# Patient Record
Sex: Male | Born: 1955
Health system: Southern US, Community
[De-identification: ages and names within clinical notes are randomized; demographics above are authoritative.]

## PROBLEM LIST (undated history)

## (undated) DIAGNOSIS — J9383 Other pneumothorax: Secondary | ICD-10-CM

## (undated) DIAGNOSIS — K219 Gastro-esophageal reflux disease without esophagitis: Secondary | ICD-10-CM

## (undated) DIAGNOSIS — J449 Chronic obstructive pulmonary disease, unspecified: Secondary | ICD-10-CM

---

## 2002-12-02 ENCOUNTER — Encounter: Admission: RE | Admit: 2002-12-02 | Discharge: 2002-12-02 | Payer: Self-pay | Admitting: Internal Medicine

## 2002-12-02 ENCOUNTER — Encounter: Payer: Self-pay | Admitting: Internal Medicine

## 2005-05-19 ENCOUNTER — Ambulatory Visit: Payer: Self-pay | Admitting: Pulmonary Disease

## 2005-09-10 ENCOUNTER — Ambulatory Visit: Payer: Self-pay | Admitting: Internal Medicine

## 2005-11-17 ENCOUNTER — Ambulatory Visit: Payer: Self-pay | Admitting: Internal Medicine

## 2005-11-20 ENCOUNTER — Ambulatory Visit: Payer: Self-pay | Admitting: Internal Medicine

## 2006-05-04 ENCOUNTER — Ambulatory Visit: Payer: Self-pay | Admitting: Internal Medicine

## 2007-08-31 ENCOUNTER — Encounter: Payer: Self-pay | Admitting: Internal Medicine

## 2007-08-31 DIAGNOSIS — E7800 Pure hypercholesterolemia, unspecified: Secondary | ICD-10-CM | POA: Insufficient documentation

## 2016-09-15 DIAGNOSIS — J9383 Other pneumothorax: Secondary | ICD-10-CM

## 2016-09-15 HISTORY — DX: Other pneumothorax: J93.83

## 2016-09-16 ENCOUNTER — Inpatient Hospital Stay (HOSPITAL_COMMUNITY)
Admission: AD | Admit: 2016-09-16 | Discharge: 2016-09-23 | DRG: 163 | Disposition: A | Discharge status: Home Health | Payer: 59 | Source: Other Acute Inpatient Hospital | Attending: Emergency Medicine | Admitting: Emergency Medicine

## 2016-09-16 ENCOUNTER — Inpatient Hospital Stay (HOSPITAL_COMMUNITY): Payer: 59

## 2016-09-16 ENCOUNTER — Encounter (HOSPITAL_COMMUNITY): Payer: Self-pay | Admitting: Thoracic Surgery (Cardiothoracic Vascular Surgery)

## 2016-09-16 DIAGNOSIS — E871 Hypo-osmolality and hyponatremia: Secondary | ICD-10-CM | POA: Diagnosis not present

## 2016-09-16 DIAGNOSIS — J431 Panlobular emphysema: Secondary | ICD-10-CM | POA: Diagnosis not present

## 2016-09-16 DIAGNOSIS — Z4682 Encounter for fitting and adjustment of non-vascular catheter: Secondary | ICD-10-CM

## 2016-09-16 DIAGNOSIS — J930 Spontaneous tension pneumothorax: Secondary | ICD-10-CM

## 2016-09-16 DIAGNOSIS — R739 Hyperglycemia, unspecified: Secondary | ICD-10-CM | POA: Diagnosis present

## 2016-09-16 DIAGNOSIS — J9382 Other air leak: Secondary | ICD-10-CM | POA: Diagnosis present

## 2016-09-16 DIAGNOSIS — R0602 Shortness of breath: Secondary | ICD-10-CM | POA: Diagnosis present

## 2016-09-16 DIAGNOSIS — D72829 Elevated white blood cell count, unspecified: Secondary | ICD-10-CM | POA: Diagnosis present

## 2016-09-16 DIAGNOSIS — Z87891 Personal history of nicotine dependence: Secondary | ICD-10-CM

## 2016-09-16 DIAGNOSIS — J432 Centrilobular emphysema: Secondary | ICD-10-CM | POA: Diagnosis not present

## 2016-09-16 DIAGNOSIS — Z9689 Presence of other specified functional implants: Secondary | ICD-10-CM

## 2016-09-16 DIAGNOSIS — F419 Anxiety disorder, unspecified: Secondary | ICD-10-CM | POA: Diagnosis not present

## 2016-09-16 DIAGNOSIS — Z825 Family history of asthma and other chronic lower respiratory diseases: Secondary | ICD-10-CM | POA: Diagnosis not present

## 2016-09-16 DIAGNOSIS — D751 Secondary polycythemia: Secondary | ICD-10-CM | POA: Diagnosis present

## 2016-09-16 DIAGNOSIS — K219 Gastro-esophageal reflux disease without esophagitis: Secondary | ICD-10-CM | POA: Diagnosis present

## 2016-09-16 DIAGNOSIS — J9383 Other pneumothorax: Secondary | ICD-10-CM

## 2016-09-16 DIAGNOSIS — J9621 Acute and chronic respiratory failure with hypoxia: Secondary | ICD-10-CM | POA: Diagnosis not present

## 2016-09-16 DIAGNOSIS — Z9911 Dependence on respirator [ventilator] status: Secondary | ICD-10-CM

## 2016-09-16 DIAGNOSIS — J449 Chronic obstructive pulmonary disease, unspecified: Secondary | ICD-10-CM

## 2016-09-16 DIAGNOSIS — Z09 Encounter for follow-up examination after completed treatment for conditions other than malignant neoplasm: Secondary | ICD-10-CM

## 2016-09-16 DIAGNOSIS — D62 Acute posthemorrhagic anemia: Secondary | ICD-10-CM | POA: Diagnosis not present

## 2016-09-16 DIAGNOSIS — J439 Emphysema, unspecified: Secondary | ICD-10-CM | POA: Diagnosis present

## 2016-09-16 DIAGNOSIS — R0902 Hypoxemia: Secondary | ICD-10-CM

## 2016-09-16 DIAGNOSIS — J9311 Primary spontaneous pneumothorax: Secondary | ICD-10-CM

## 2016-09-16 DIAGNOSIS — J4489 Other specified chronic obstructive pulmonary disease: Secondary | ICD-10-CM | POA: Diagnosis present

## 2016-09-16 DIAGNOSIS — J939 Pneumothorax, unspecified: Secondary | ICD-10-CM

## 2016-09-16 DIAGNOSIS — E876 Hypokalemia: Secondary | ICD-10-CM | POA: Diagnosis not present

## 2016-09-16 HISTORY — DX: Chronic obstructive pulmonary disease, unspecified: J44.9

## 2016-09-16 HISTORY — DX: Gastro-esophageal reflux disease without esophagitis: K21.9

## 2016-09-16 HISTORY — DX: Other pneumothorax: J93.83

## 2016-09-16 LAB — COMPREHENSIVE METABOLIC PANEL
ALBUMIN: 3.8 g/dL (ref 3.5–5.0)
ALK PHOS: 57 U/L (ref 38–126)
ALT: 20 U/L (ref 17–63)
ANION GAP: 11 (ref 5–15)
AST: 21 U/L (ref 15–41)
BILIRUBIN TOTAL: 1.4 mg/dL — AB (ref 0.3–1.2)
BUN: 14 mg/dL (ref 6–20)
CALCIUM: 9 mg/dL (ref 8.9–10.3)
CO2: 21 mmol/L — ABNORMAL LOW (ref 22–32)
Chloride: 105 mmol/L (ref 101–111)
Creatinine, Ser: 1.02 mg/dL (ref 0.61–1.24)
GFR calc Af Amer: >60 mL/min (ref >60)
GLUCOSE: 127 mg/dL — AB (ref 65–99)
Potassium: 4.4 mmol/L (ref 3.5–5.1)
Sodium: 137 mmol/L (ref 135–145)
TOTAL PROTEIN: 7.4 g/dL (ref 6.5–8.1)

## 2016-09-16 LAB — CBC WITH DIFFERENTIAL/PLATELET
BASOS ABS: 0 10*3/uL (ref 0.0–0.1)
BASOS PCT: 0 %
EOS PCT: 0 %
Eosinophils Absolute: 0 10*3/uL (ref 0.0–0.7)
HCT: 45.7 % (ref 39.0–52.0)
Hemoglobin: 16.1 g/dL (ref 13.0–17.0)
Lymphocytes Relative: 5 %
Lymphs Abs: 1 10*3/uL (ref 0.7–4.0)
MCH: 31.8 pg (ref 26.0–34.0)
MCHC: 35.2 g/dL (ref 30.0–36.0)
MCV: 90.3 fL (ref 78.0–100.0)
MONO ABS: 1.5 10*3/uL — AB (ref 0.1–1.0)
Monocytes Relative: 7 %
Neutro Abs: 17.6 10*3/uL — ABNORMAL HIGH (ref 1.7–7.7)
Neutrophils Relative %: 88 %
PLATELETS: 240 10*3/uL (ref 150–400)
RBC: 5.06 MIL/uL (ref 4.22–5.81)
RDW: 12.6 % (ref 11.5–15.5)
WBC: 20.1 10*3/uL — AB (ref 4.0–10.5)

## 2016-09-16 LAB — GLUCOSE, CAPILLARY
GLUCOSE-CAPILLARY: 130 mg/dL — AB (ref 65–99)
Glucose-Capillary: 105 mg/dL — ABNORMAL HIGH (ref 65–99)
Glucose-Capillary: 111 mg/dL — ABNORMAL HIGH (ref 65–99)

## 2016-09-16 LAB — PROTIME-INR
INR: 1.08
PROTHROMBIN TIME: 14 s (ref 11.4–15.2)

## 2016-09-16 LAB — SURGICAL PCR SCREEN
MRSA, PCR: NEGATIVE
Staphylococcus aureus: NEGATIVE

## 2016-09-16 LAB — APTT: APTT: 35 s (ref 24–36)

## 2016-09-16 MED ORDER — ALBUTEROL SULFATE (2.5 MG/3ML) 0.083% IN NEBU
2.5000 mg | INHALATION_SOLUTION | RESPIRATORY_TRACT | Status: DC | PRN
  Administered 2016-09-17: 2.5 mg via RESPIRATORY_TRACT
  Filled 2016-09-16: qty 3

## 2016-09-16 MED ORDER — IPRATROPIUM-ALBUTEROL 0.5-2.5 (3) MG/3ML IN SOLN: 3.0000 mL | Freq: Four times a day (QID) | RESPIRATORY_TRACT | Status: DC

## 2016-09-16 MED ORDER — INSULIN ASPART 100 UNIT/ML ~~LOC~~ SOLN
0.0000 [IU] | SUBCUTANEOUS | Status: DC
  Administered 2016-09-16: 1 [IU] via SUBCUTANEOUS

## 2016-09-16 MED ORDER — ENOXAPARIN SODIUM 40 MG/0.4ML ~~LOC~~ SOLN
40.0000 mg | SUBCUTANEOUS | Status: DC
  Filled 2016-09-16: qty 0.4

## 2016-09-16 MED ORDER — ORAL CARE MOUTH RINSE
15.0000 mL | Freq: Two times a day (BID) | OROMUCOSAL | Status: DC
  Administered 2016-09-16 – 2016-09-17 (×2): 15 mL via OROMUCOSAL

## 2016-09-16 MED ORDER — PANTOPRAZOLE SODIUM 40 MG PO TBEC: 40.0000 mg | DELAYED_RELEASE_TABLET | Freq: Every day | ORAL | Status: DC

## 2016-09-16 MED ORDER — FENTANYL CITRATE (PF) 100 MCG/2ML IJ SOLN
25.0000 ug | INTRAMUSCULAR | Status: DC | PRN
Start: 2016-09-16 — End: 2016-09-17
  Administered 2016-09-17: 25 ug via INTRAVENOUS
  Filled 2016-09-16: qty 2

## 2016-09-16 MED ORDER — IPRATROPIUM-ALBUTEROL 0.5-2.5 (3) MG/3ML IN SOLN
3.0000 mL | Freq: Four times a day (QID) | RESPIRATORY_TRACT | Status: DC
Start: 2016-09-16 — End: 2016-09-17
  Administered 2016-09-16 – 2016-09-17 (×3): 3 mL via RESPIRATORY_TRACT
  Filled 2016-09-16 (×3): qty 3

## 2016-09-16 MED ORDER — ENOXAPARIN SODIUM 30 MG/0.3ML ~~LOC~~ SOLN
30.0000 mg | SUBCUTANEOUS | Status: DC
  Administered 2016-09-16: 30 mg via SUBCUTANEOUS
  Filled 2016-09-16: qty 0.3

## 2016-09-16 MED ORDER — ONDANSETRON HCL 4 MG/2ML IJ SOLN: 4.0000 mg | Freq: Four times a day (QID) | INTRAMUSCULAR | Status: DC | PRN

## 2016-09-16 MED ORDER — ACETAMINOPHEN 325 MG PO TABS
650.0000 mg | ORAL_TABLET | ORAL | Status: DC | PRN
  Administered 2016-09-16: 650 mg via ORAL
  Filled 2016-09-16: qty 2

## 2016-09-16 MED ORDER — LACTATED RINGERS IV SOLN
INTRAVENOUS | Status: DC
  Administered 2016-09-16 – 2016-09-17 (×3): via INTRAVENOUS

## 2016-09-16 NOTE — H&P (Addendum)
PULMONARY / CRITICAL CARE MEDICINE   Name: Marlene LardJoseph T Mori MRN: [id number] DOB: 02/23/1956    ADMISSION DATE:  09/16/2016 CONSULTATION DATE:  11/7    REFERRING MD:  Transfer IN  CHIEF COMPLAINT:  PTX , leak , COPD  HISTORY OF PRESENT ILLNESS:   60 yr old h/o COPD, remote PNA, admitted 11/6 for abrupt SOB when getting out of bed. To hospital noted spont large PTX with total lung collapse. A small CT was placed then a larger and small removed. Chest tube continued to leak excessive air and was referred to [facility] for pul/ CVTS specialty care. No fevers. No CP After CT placement lung cam eup but remains with PTX moderate with leak described.  PAST MEDICAL HISTORY :  He  has no past medical history on file.  COPD Remote PNA Allerggies  PAST SURGICAL HISTORY: He  has no past surgical history on file.  Allergies  Allergen Reactions  . Meperidine Hcl     No current facility-administered medications on file prior to encounter.    No current outpatient prescriptions on file prior to encounter.    FAMILY HISTORY:  His has no family status information on file.  copd  SOCIAL HISTORY: He   nonsmoker, no drugs  REVIEW OF SYSTEMS:   Review of Systems  Constitutional: Positive for diaphoresis and malaise/fatigue. Negative for chills, fever and weight loss.  HENT: Negative for congestion, ear discharge, ear pain, hearing loss, nosebleeds and tinnitus.   Eyes: Negative for blurred vision, double vision, photophobia and pain.  Respiratory: Positive for shortness of breath and wheezing. Negative for cough, hemoptysis and sputum production.   Cardiovascular: Positive for chest pain. Negative for palpitations, orthopnea, claudication, leg swelling and PND.  Gastrointestinal: Negative for abdominal pain, diarrhea, heartburn and vomiting.  Genitourinary: Negative for dysuria, frequency, hematuria and urgency.  Musculoskeletal: Negative for back pain, myalgias and neck pain.  Skin: Negative  for itching and rash.  Neurological: Positive for dizziness. Negative for tingling, weakness and headaches.  Endo/Heme/Allergies: Negative for environmental allergies. Does not bruise/bleed easily.  Psychiatric/Behavioral: Negative for depression.     SUBJECTIVE:  Improved breathing overall  VITAL SIGNS: BP 128/89 (BP Location: Right Arm)   Pulse (!) 110   Temp 98.2 F (36.8 C) (Oral)   Resp (!) 22   Ht 6\' 1"  (1.854 m)   Wt 84.5 kg (186 lb 4.6 oz)   SpO2 90%   BMI 24.58 kg/m   HEMODYNAMICS:    VENTILATOR SETTINGS:    INTAKE / OUTPUT: No intake/output data recorded.  PHYSICAL EXAMINATION: General:  Awake, no distress Neuro:  NONFOCAL, A O x 4 HEENT:  jvd present wnl Cardiovascular:  s1 s2 RRR no longer tachy, no r/g Lungs:  Distant, no crepitus Abdomen:  Soft, BS wnl, no r Musculoskeletal:  No edema Skin:  No rash Leak on CT sahara  LABS:  BMET No results for input(s): NA, K, CL, CO2, BUN, CREATININE, GLUCOSE in the last 168 hours.  Electrolytes No results for input(s): CALCIUM, MG, PHOS in the last 168 hours.  CBC No results for input(s): WBC, HGB, HCT, PLT in the last 168 hours.  Coag's No results for input(s): APTT, INR in the last 168 hours.  Sepsis Markers No results for input(s): LATICACIDVEN, PROCALCITON, O2SATVEN in the last 168 hours.  ABG No results for input(s): PHART, PCO2ART, PO2ART in the last 168 hours.  Liver Enzymes No results for input(s): AST, ALT, ALKPHOS, BILITOT, ALBUMIN in the last 168  hours.  Cardiac Enzymes No results for input(s): TROPONINI, PROBNP in the last 168 hours.  Glucose No results for input(s): GLUCAP in the last 168 hours.  Imaging Dg Chest Port 1 View  Result Date: 09/16/2016 CLINICAL DATA:  Left side chest tubes EXAM: PORTABLE CHEST 1 VIEW COMPARISON:  None. FINDINGS: Cardiomediastinal silhouette is unremarkable. A left basilar chest tube is noted. There is about 20% left upper and left lateral  pneumothorax. Atelectasis noted bilateral basilar. IMPRESSION: Left basilar chest tube. About 20% left pneumothorax. Bilateral basilar atelectasis. Electronically Signed   By: Natasha MeadLiviu  Pop M.D.   On: 09/16/2016 17:12     STUDIES:  CT chest 11/6>>>large PTX, totoal left collapse from PTX, extensive COPD  CULTURES:   ANTIBIOTICS:   SIGNIFICANT EVENTS: 11/6- PTX spont, large with collapse, CT placed 11/7- no resolution of PTX fully with large leak, [facility]  LINES/TUBES: 11/6 outside hospt left CT>>>  ASSESSMENT / PLAN:  PULMONARY A: LArge PTX spont R/o BP fistula Large air leak, r/o intraparychmal placement SUSPECT advances COPD P:   appreciate CVTS consult Obtain CT chest to assess placement of CT and current residual PTX location pcxr now Albuteral IF CT not intraparenchymal may need additional CT placement vs VATS IS  CARDIOVASCULAR A:  Tachy resolved P:  Tele Treating PTX  RENAL A:   Prior normal CRT P:   LR okay for now, assess bmet now LR kvo if eating  GASTROINTESTINAL A:   R/o GERD P:   PPI  NPO for CT abnd possible other procedures  HEMATOLOGIC A:   Secondary polycythemia P:  Cbc coags now for possible procedures Dc lov scd add  INFECTIOUS A:   No infection present P:   Follow temp curve  ENDOCRINE A:   At risk hyperglcyemia P:   ssi  NEUROLOGIC A:   pain P:   RASS goal: 0 Consider prn fent   FAMILY  - Updates: to pt and all family in room  - Inter-disciplinary family meet or Palliative Care meeting due by: 11/14  Ccm time 35 min   Mcarthur Rossettianiel J. Tyson AliasFeinstein, MD, FACP Pgr: [phone] [facility]  Pulmonary and Critical Care Medicine [facility] Pager: [phone]  09/16/2016, 5:32 PM

## 2016-09-16 NOTE — Progress Notes (Signed)
eLink Physician-Brief Progress Note Patient Name: Jay LardJoseph T Mitchell DOB: 07/16/1956 MRN: [id number]   Date of Service  09/16/2016  HPI/Events of Note  Pt transferred from [facility] to [facility] for mgmt of spont PTX and persistent, large air leak  eICU Interventions  Stat PCXR ordered Basic admission orders entered Dr Tyson AliasFeinstein to see pt shortly      Intervention Category Evaluation Type: New Patient Evaluation  Merwyn KatosDavid B Georgenia Salim 09/16/2016, 4:32 PM

## 2016-09-16 NOTE — Consult Note (Signed)
[address]       [place] [id number]             [phone]          CARDIOTHORACIC SURGERY CONSULTATION REPORT  PCP is No primary care provider on file. Referring Provider is SIMONDS, DAVID, MD   Reason for consultation:  Spontaneous pneumothorax  HPI:  Patient is a 60 year old male with reported history of COPD, remote history of tobacco use, and GE reflux disease who was in his usual state of health until early yesterday morning when he developed sudden onset of left-sided chest pain and shortness of breath. At the time he was traveling in the [place] for business where he was attending a conference. He was evaluated in the emergency department there and found to have a large left pneumothorax with complete collapse of the left lung. A small bore chest tube was placed but the lung only partially reexpanded. Subsequently the initial chest tube was removed and the larger chest tube placed by a surgeon. The patient was noted to have persistent air leak, and at the patient's request he was transferred to [facility] for further management. Cardiothoracic surgical consultation was requested upon arrival.  The patient is married and lives [place] his [place] locally in [place] 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 He has a remote history of tobacco abuse but he quit smoking in 2004. He states that approximately 10 years ago he suffered from a case of pneumonia, and he was told that chest x-ray obtained at that time revealed findings consistent with COPD. He has remained physically active throughout his adult life although he does not exercise on a regular basis. He reports no significant physical limitations prior to acute onset of chest pain and shortness of breath that occurred early yesterday morning. He specifically denies any problems with shortness of breath, wheezing,  or frequent bouts of bronchitis prior to admission.  The patient has no previous history of spontaneous pneumothorax and he specifically denies any recent history of trauma.  He states that initially after chest tube placement he had fairly severe pain across left side of his chest. Pain has improved considerably over the last 6-12 hours and at present he has minimal discomfort associated with the tube. The patient denies any shortness of breath at present and reports that his breathing is much better than it was prior to initial chest tube placement.  Past Medical History:  Diagnosis Date  . COPD (chronic obstructive pulmonary disease) (HCC)   . GERD (gastroesophageal reflux disease)   . Spontaneous pneumothorax 09/15/2016   left    History reviewed. No pertinent surgical history.  History reviewed. No pertinent family history.  Social History   Social History  . Marital status: Married    Spouse name: N/A  . Number of children: N/A  . Years of education: N/A   Occupational History  . Not on file.   Social History Main Topics  . Smoking status: Former Smoker    Packs/day: 1.00    Types: Cigarettes    Quit date: 2004  . Smokeless tobacco: Never Used  . Alcohol use No  . Drug use: No  . Sexual activity: Not on file   Other Topics Concern  . Not on file   Social History Narrative  . No narrative on file    Prior to Admission medications  Not on File    Current Facility-Administered Medications  Medication Dose Route Frequency Provider Last Rate Last Dose  . acetaminophen (TYLENOL) tablet 650 mg  650 mg Oral Q4H PRN Merwyn Katosavid B Simonds, MD      . albuterol (PROVENTIL) (2.5 MG/3ML) 0.083% nebulizer solution 2.5 mg  2.5 mg Nebulization Q2H PRN Merwyn Katosavid B Simonds, MD      . fentaNYL (SUBLIMAZE) injection 25 mcg  25 mcg Intravenous Q2H PRN Nelda Bucksaniel J Feinstein, MD      . insulin aspart (novoLOG) injection 0-9 Units  0-9 Units Subcutaneous Q4H Nelda Bucksaniel J Feinstein, MD      .  ipratropium-albuterol (DUONEB) 0.5-2.5 (3) MG/3ML nebulizer solution 3 mL  3 mL Nebulization Q6H Merwyn Katosavid B Simonds, MD      . lactated ringers infusion   Intravenous Continuous Purcell Nailslarence H Faithlynn Deeley, MD 50 mL/hr at 09/16/16 1650    . ondansetron (ZOFRAN) injection 4 mg  4 mg Intravenous Q6H PRN Merwyn Katosavid B Simonds, MD      . Melene Muller[START ON 09/17/2016] pantoprazole (PROTONIX) EC tablet 40 mg  40 mg Oral Q1200 Nelda Bucksaniel J Feinstein, MD        Allergies  Allergen Reactions  . Meperidine Hcl       Review of Systems:   General:  normal appetite, normal energy, no weight gain, no weight loss, no fever  Cardiac:  no chest pain with exertion, + chest pain at rest which began yesterday, no SOB with exertion, + resting SOB which began yesterday, no PND, no orthopnea, no palpitations, no arrhythmia, no atrial fibrillation, no LE edema, no dizzy spells, no syncope  Respiratory:  + shortness of breath, no home oxygen, no productive cough, no dry cough, no bronchitis, n wheezing, no hemoptysis, no asthma, + pain with inspiration or cough, no sleep apnea, no CPAP at night  GI:   no difficulty swallowing, + reflux, no frequent heartburn, no hiatal hernia, no abdominal pain, no constipation, no diarrhea, no hematochezia, no hematemesis, no melena  GU:   no dysuria,  no frequency, no urinary tract infection, no hematuria, no enlarged prostate, + kidney stones, no kidney disease  Vascular:  no pain suggestive of claudication, no pain in feet, no leg cramps, no varicose veins, no DVT, no non-healing foot ulcer  Neuro:   no stroke, no TIA's, no seizures, no headaches, no temporary blindness one eye,  no slurred speech, no peripheral neuropathy, no chronic pain, no instability of gait, no memory/cognitive dysfunction  Musculoskeletal: no arthritis, no joint swelling, + myalgias, no difficulty walking, normal mobility   Skin:   no rash, no itching, no skin infections, no pressure sores or ulcerations  Psych:   no anxiety, no  depression, no nervousness, no unusual recent stress  Eyes:   no blurry vision, no floaters, no recent vision changes, + wears glasses or contacts  ENT:   no hearing loss, no loose or painful teeth, no dentures  Hematologic:  no easy bruising, no abnormal bleeding, no clotting disorder, no frequent epistaxis  Endocrine:  no diabetes, does not check CBG's at home     Physical Exam:   BP 128/89 (BP Location: Right Arm)   Pulse (!) 110   Temp 98.2 F (36.8 C) (Oral)   Resp (!) 22   Ht 6\' 1"  (1.854 m)   Wt 186 lb 4.6 oz (84.5 kg)   SpO2 90%   BMI 24.58 kg/m   General:    well-appearing  HEENT:  Unremarkable   Neck:  no JVD, no bruits, no adenopathy   Chest:   clear to auscultation, symmetrical breath sounds, no wheezes, no rhonchi  Chest tube:  Single chest tube in place left chest, + small air leak with tube on water seal  CV:   RRR, no  murmur   Abdomen:  soft, non-tender, no masses   Extremities:  warm, well-perfused, pulses palpable, no lower extremity edema  Rectal/GU  Deferred  Neuro:   Grossly non-focal and symmetrical throughout  Skin:   Clean and dry, no rashes, no breakdown  Diagnostic Tests:  PORTABLE CHEST 1 VIEW  COMPARISON:  None.  FINDINGS: Cardiomediastinal silhouette is unremarkable. A left basilar chest tube is noted. There is about 20% left upper and left lateral pneumothorax. Atelectasis noted bilateral basilar.  IMPRESSION: Left basilar chest tube. About 20% left pneumothorax. Bilateral basilar atelectasis.   Electronically Signed   By: Natasha Mead M.D.   On: 09/16/2016 17:12    Impression:  Patient with reported history of COPD and remote history of tobacco abuse presents with first time primary spontaneous pneumothorax on the left side.  Chest tube was placed at outside hospital and clinically the patient appears quite stable and reports feeling much improved. He has a small persistent air leak with the tube on water seal. Chest x-ray  demonstrates reexpansion of the left lung with approximately 20% residual left apical pneumothorax. The tube may be in the major fissure, although position is difficult to tell for certain on the existing portable chest x-ray.   Plan:  Will proceed with chest CT scan to verify position of the existing chest tube and further evaluate anatomical characteristics of the patient's underlying COPD. Depending upon findings we will discuss treatment options further.   I spent in excess of 60 minutes during the conduct of this hospital consultation and >50% of this time involved direct face-to-face encounter for counseling and/or coordination of the patient's care.   Salvatore Decent. Cornelius Moras, MD 09/16/2016 5:54 PM

## 2016-09-16 NOTE — Significant Event (Addendum)
Accepted patient from OSH into 2M08. Patient arrived to unit on nonrebreather, two PIV on each ACs, and pleural tube on left chest that was on suctioned at -20cm en route from the outside hospital to [facility]. Large air leak noted on CT. CT back to waterseal, staff changed to new sahara. Still with persistent air leak. Personal belongings include a cellphone and pajama pants. No family at bedside-they are en route to hospital. Patient oriented to room and unit. Bed in lowest position. CCM MD notified and camera in room.

## 2016-09-17 ENCOUNTER — Inpatient Hospital Stay (HOSPITAL_COMMUNITY): Payer: 59

## 2016-09-17 ENCOUNTER — Encounter (HOSPITAL_COMMUNITY): Payer: Self-pay | Admitting: Anesthesiology

## 2016-09-17 ENCOUNTER — Inpatient Hospital Stay (HOSPITAL_COMMUNITY): Payer: 59 | Admitting: Anesthesiology

## 2016-09-17 ENCOUNTER — Encounter (HOSPITAL_COMMUNITY)
Admission: AD | Disposition: A | Discharge status: Home Health | Payer: Self-pay | Source: Other Acute Inpatient Hospital | Attending: Thoracic Surgery (Cardiothoracic Vascular Surgery)

## 2016-09-17 DIAGNOSIS — Z09 Encounter for follow-up examination after completed treatment for conditions other than malignant neoplasm: Secondary | ICD-10-CM

## 2016-09-17 DIAGNOSIS — J439 Emphysema, unspecified: Secondary | ICD-10-CM | POA: Diagnosis present

## 2016-09-17 DIAGNOSIS — J930 Spontaneous tension pneumothorax: Secondary | ICD-10-CM

## 2016-09-17 HISTORY — PX: VIDEO ASSISTED THORACOSCOPY: SHX5073

## 2016-09-17 LAB — BASIC METABOLIC PANEL
Anion gap: 10 (ref 5–15)
BUN: 15 mg/dL (ref 6–20)
CALCIUM: 8.8 mg/dL — AB (ref 8.9–10.3)
CHLORIDE: 102 mmol/L (ref 101–111)
CO2: 25 mmol/L (ref 22–32)
CREATININE: 1 mg/dL (ref 0.61–1.24)
GFR calc Af Amer: >60 mL/min (ref >60)
GFR calc non Af Amer: >60 mL/min (ref >60)
Glucose, Bld: 103 mg/dL — ABNORMAL HIGH (ref 65–99)
Potassium: 4.5 mmol/L (ref 3.5–5.1)
SODIUM: 137 mmol/L (ref 135–145)

## 2016-09-17 LAB — CBC
HCT: 42.7 % (ref 39.0–52.0)
HEMOGLOBIN: 14.5 g/dL (ref 13.0–17.0)
MCH: 30.9 pg (ref 26.0–34.0)
MCHC: 34 g/dL (ref 30.0–36.0)
MCV: 90.9 fL (ref 78.0–100.0)
PLATELETS: 241 10*3/uL (ref 150–400)
RBC: 4.7 MIL/uL (ref 4.22–5.81)
RDW: 12.5 % (ref 11.5–15.5)
WBC: 14.8 10*3/uL — ABNORMAL HIGH (ref 4.0–10.5)

## 2016-09-17 LAB — TYPE AND SCREEN
ABO/RH(D): O POS
ANTIBODY SCREEN: NEGATIVE

## 2016-09-17 LAB — GLUCOSE, CAPILLARY
GLUCOSE-CAPILLARY: 110 mg/dL — AB (ref 65–99)
GLUCOSE-CAPILLARY: 150 mg/dL — AB (ref 65–99)
Glucose-Capillary: 99 mg/dL (ref 65–99)
Glucose-Capillary: 169 mg/dL — ABNORMAL HIGH (ref 65–99)
Glucose-Capillary: 146 mg/dL — ABNORMAL HIGH (ref 65–99)

## 2016-09-17 LAB — ABO/RH: ABO/RH(D): O POS

## 2016-09-17 SURGERY — VIDEO ASSISTED THORACOSCOPY
Anesthesia: General | Site: Chest | Laterality: Left

## 2016-09-17 MED ORDER — DIPHENHYDRAMINE HCL 50 MG/ML IJ SOLN: 12.5000 mg | Freq: Four times a day (QID) | INTRAMUSCULAR | Status: DC | PRN

## 2016-09-17 MED ORDER — POTASSIUM CHLORIDE 10 MEQ/50ML IV SOLN: 10.0000 meq | Freq: Every day | INTRAVENOUS | Status: DC | PRN

## 2016-09-17 MED ORDER — DEXAMETHASONE SODIUM PHOSPHATE 10 MG/ML IJ SOLN
INTRAMUSCULAR | Status: DC | PRN
  Administered 2016-09-17: 10 mg via INTRAVENOUS

## 2016-09-17 MED ORDER — METOCLOPRAMIDE HCL 5 MG/ML IJ SOLN: 10.0000 mg | Freq: Once | INTRAMUSCULAR | Status: DC | PRN

## 2016-09-17 MED ORDER — CEFUROXIME SODIUM 1.5 G IJ SOLR
1.5000 g | Freq: Two times a day (BID) | INTRAMUSCULAR | Status: AC
  Administered 2016-09-17 – 2016-09-18 (×2): 1.5 g via INTRAVENOUS
  Filled 2016-09-17 (×2): qty 1.5

## 2016-09-17 MED ORDER — CEFAZOLIN SODIUM-DEXTROSE 2-3 GM-% IV SOLR
INTRAVENOUS | Status: DC | PRN
  Administered 2016-09-17: 2 g via INTRAVENOUS

## 2016-09-17 MED ORDER — ONDANSETRON HCL 4 MG/2ML IJ SOLN: 4.0000 mg | Freq: Four times a day (QID) | INTRAMUSCULAR | Status: DC | PRN

## 2016-09-17 MED ORDER — INSULIN ASPART 100 UNIT/ML ~~LOC~~ SOLN
0.0000 [IU] | SUBCUTANEOUS | Status: DC
  Administered 2016-09-17: 2 [IU] via SUBCUTANEOUS
  Administered 2016-09-17: 4 [IU] via SUBCUTANEOUS
  Administered 2016-09-18 (×3): 2 [IU] via SUBCUTANEOUS

## 2016-09-17 MED ORDER — ONDANSETRON HCL 4 MG/2ML IJ SOLN
INTRAMUSCULAR | Status: DC | PRN
  Administered 2016-09-17: 4 mg via INTRAVENOUS

## 2016-09-17 MED ORDER — PROPOFOL 10 MG/ML IV BOLUS
INTRAVENOUS | Status: AC
  Filled 2016-09-17: qty 20

## 2016-09-17 MED ORDER — ACETAMINOPHEN 500 MG PO TABS
1000.0000 mg | ORAL_TABLET | Freq: Four times a day (QID) | ORAL | Status: DC
  Administered 2016-09-17 – 2016-09-18 (×3): 1000 mg
  Filled 2016-09-17 (×5): qty 2

## 2016-09-17 MED ORDER — SENNOSIDES-DOCUSATE SODIUM 8.6-50 MG PO TABS
1.0000 | ORAL_TABLET | Freq: Every day | ORAL | Status: DC
  Administered 2016-09-18 – 2016-09-20 (×3): 1 via ORAL
  Filled 2016-09-17 (×4): qty 1

## 2016-09-17 MED ORDER — FENTANYL CITRATE (PF) 100 MCG/2ML IJ SOLN: 100.0000 ug | INTRAMUSCULAR | Status: DC | PRN

## 2016-09-17 MED ORDER — FENTANYL CITRATE (PF) 100 MCG/2ML IJ SOLN
INTRAMUSCULAR | Status: AC
  Administered 2016-09-17: 50 ug via INTRAVENOUS
  Filled 2016-09-17: qty 2

## 2016-09-17 MED ORDER — MIDAZOLAM HCL 5 MG/5ML IJ SOLN
INTRAMUSCULAR | Status: DC | PRN
  Administered 2016-09-17 (×2): 1 mg via INTRAVENOUS

## 2016-09-17 MED ORDER — GLYCOPYRROLATE 0.2 MG/ML IJ SOLN
INTRAMUSCULAR | Status: DC | PRN
Start: 2016-09-17 — End: 2016-09-17
  Administered 2016-09-17: 0.4 mg via INTRAVENOUS

## 2016-09-17 MED ORDER — DOCUSATE SODIUM 50 MG/5ML PO LIQD: 100.0000 mg | Freq: Two times a day (BID) | ORAL | Status: DC | PRN

## 2016-09-17 MED ORDER — TRAMADOL HCL 50 MG PO TABS
50.0000 mg | ORAL_TABLET | Freq: Four times a day (QID) | ORAL | Status: DC | PRN
  Administered 2016-09-17 – 2016-09-19 (×6): 100 mg via ORAL
  Filled 2016-09-17 (×6): qty 2

## 2016-09-17 MED ORDER — BISACODYL 5 MG PO TBEC: 10.0000 mg | DELAYED_RELEASE_TABLET | Freq: Every day | ORAL | Status: DC

## 2016-09-17 MED ORDER — FENTANYL 40 MCG/ML IV SOLN: INTRAVENOUS | Status: DC

## 2016-09-17 MED ORDER — DEXTROSE-NACL 5-0.45 % IV SOLN: INTRAVENOUS | Status: DC

## 2016-09-17 MED ORDER — DEXMEDETOMIDINE HCL IN NACL 200 MCG/50ML IV SOLN: 0.0000 ug/kg/h | INTRAVENOUS | Status: DC

## 2016-09-17 MED ORDER — LACTATED RINGERS IV SOLN: INTRAVENOUS | Status: DC

## 2016-09-17 MED ORDER — SUGAMMADEX SODIUM 200 MG/2ML IV SOLN
INTRAVENOUS | Status: DC | PRN
  Administered 2016-09-17: 200 mg via INTRAVENOUS

## 2016-09-17 MED ORDER — PANTOPRAZOLE SODIUM 40 MG PO TBEC: 40.0000 mg | DELAYED_RELEASE_TABLET | Freq: Every day | ORAL | Status: DC

## 2016-09-17 MED ORDER — SODIUM CHLORIDE 0.9% FLUSH: 9.0000 mL | INTRAVENOUS | Status: DC | PRN

## 2016-09-17 MED ORDER — ACETAMINOPHEN 160 MG/5ML PO SOLN: 1000.0000 mg | Freq: Four times a day (QID) | ORAL | Status: DC

## 2016-09-17 MED ORDER — NEOSTIGMINE METHYLSULFATE 10 MG/10ML IV SOLN
INTRAVENOUS | Status: DC | PRN
  Administered 2016-09-17: 3 mg via INTRAVENOUS

## 2016-09-17 MED ORDER — FENTANYL CITRATE (PF) 250 MCG/5ML IJ SOLN
INTRAMUSCULAR | Status: AC
  Filled 2016-09-17: qty 5

## 2016-09-17 MED ORDER — DIPHENHYDRAMINE HCL 12.5 MG/5ML PO ELIX: 12.5000 mg | ORAL_SOLUTION | Freq: Four times a day (QID) | ORAL | Status: DC | PRN

## 2016-09-17 MED ORDER — LIDOCAINE HCL (CARDIAC) 20 MG/ML IV SOLN
INTRAVENOUS | Status: DC | PRN
  Administered 2016-09-17: 100 mg via INTRAVENOUS

## 2016-09-17 MED ORDER — LABETALOL HCL 5 MG/ML IV SOLN
INTRAVENOUS | Status: DC | PRN
  Administered 2016-09-17: 5 mg via INTRAVENOUS

## 2016-09-17 MED ORDER — FENTANYL CITRATE (PF) 100 MCG/2ML IJ SOLN
25.0000 ug | INTRAMUSCULAR | Status: DC | PRN
  Administered 2016-09-17 (×2): 50 ug via INTRAVENOUS

## 2016-09-17 MED ORDER — ALBUTEROL SULFATE (2.5 MG/3ML) 0.083% IN NEBU: 2.5000 mg | INHALATION_SOLUTION | RESPIRATORY_TRACT | Status: DC

## 2016-09-17 MED ORDER — BISACODYL 10 MG RE SUPP: 10.0000 mg | Freq: Every day | RECTAL | Status: DC | PRN

## 2016-09-17 MED ORDER — FENTANYL CITRATE (PF) 250 MCG/5ML IJ SOLN
INTRAMUSCULAR | Status: AC
  Filled 2016-09-17: qty 10

## 2016-09-17 MED ORDER — OXYCODONE HCL 5 MG PO TABS: 5.0000 mg | ORAL_TABLET | ORAL | Status: DC | PRN

## 2016-09-17 MED ORDER — ACETAMINOPHEN 160 MG/5ML PO SOLN
1000.0000 mg | Freq: Four times a day (QID) | ORAL | Status: DC
  Filled 2016-09-17: qty 40.6

## 2016-09-17 MED ORDER — MIDAZOLAM HCL 10 MG/2ML IJ SOLN
INTRAMUSCULAR | Status: AC
  Filled 2016-09-17: qty 2

## 2016-09-17 MED ORDER — FENTANYL CITRATE (PF) 100 MCG/2ML IJ SOLN
INTRAMUSCULAR | Status: DC | PRN
  Administered 2016-09-17 (×3): 50 ug via INTRAVENOUS
  Administered 2016-09-17 (×2): 100 ug via INTRAVENOUS
  Administered 2016-09-17: 50 ug via INTRAVENOUS
  Administered 2016-09-17: 100 ug via INTRAVENOUS

## 2016-09-17 MED ORDER — ACETAMINOPHEN 500 MG PO TABS: 1000.0000 mg | ORAL_TABLET | Freq: Four times a day (QID) | ORAL | Status: DC

## 2016-09-17 MED ORDER — ROCURONIUM BROMIDE 100 MG/10ML IV SOLN
INTRAVENOUS | Status: DC | PRN
  Administered 2016-09-17: 50 mg via INTRAVENOUS
  Administered 2016-09-17: 20 mg via INTRAVENOUS
  Administered 2016-09-17: 50 mg via INTRAVENOUS

## 2016-09-17 MED ORDER — LACTATED RINGERS IV SOLN
INTRAVENOUS | Status: DC | PRN
  Administered 2016-09-17 (×2): via INTRAVENOUS

## 2016-09-17 MED ORDER — PROPOFOL 10 MG/ML IV BOLUS
INTRAVENOUS | Status: DC | PRN
Start: 2016-09-17 — End: 2016-09-17
  Administered 2016-09-17: 160 mg via INTRAVENOUS
  Administered 2016-09-17: 150 mg via INTRAVENOUS
  Administered 2016-09-17: 40 mg via INTRAVENOUS

## 2016-09-17 MED ORDER — LORATADINE 10 MG PO TABS: 10.0000 mg | ORAL_TABLET | Freq: Every day | ORAL | Status: DC

## 2016-09-17 MED ORDER — NALOXONE HCL 0.4 MG/ML IJ SOLN: 0.4000 mg | INTRAMUSCULAR | Status: DC | PRN

## 2016-09-17 MED ORDER — 0.9 % SODIUM CHLORIDE (POUR BTL) OPTIME
TOPICAL | Status: DC | PRN
  Administered 2016-09-17: 1000 mL
  Administered 2016-09-17: 2000 mL

## 2016-09-17 MED ORDER — FAMOTIDINE IN NACL 20-0.9 MG/50ML-% IV SOLN: 20.0000 mg | Freq: Two times a day (BID) | INTRAVENOUS | Status: DC

## 2016-09-17 MED ORDER — IPRATROPIUM-ALBUTEROL 0.5-2.5 (3) MG/3ML IN SOLN: 3.0000 mL | Freq: Four times a day (QID) | RESPIRATORY_TRACT | Status: DC

## 2016-09-17 MED ORDER — ALBUTEROL SULFATE (2.5 MG/3ML) 0.083% IN NEBU: 2.5000 mg | INHALATION_SOLUTION | Freq: Four times a day (QID) | RESPIRATORY_TRACT | Status: DC

## 2016-09-17 MED ORDER — PNEUMOCOCCAL VAC POLYVALENT 25 MCG/0.5ML IJ INJ: 0.5000 mL | INJECTION | INTRAMUSCULAR | Status: DC

## 2016-09-17 SURGICAL SUPPLY — 79 items
APPLIER CLIP ROT 10 11.4 M/L (STAPLE)
CANISTER SUCTION 2500CC (MISCELLANEOUS) ×2 IMPLANT
CATH KIT ON Q 5IN SLV (PAIN MANAGEMENT) IMPLANT
CATH THORACIC 28FR (CATHETERS) ×2 IMPLANT
CATH THORACIC 28FR RT ANG (CATHETERS) IMPLANT
CATH THORACIC 36FR (CATHETERS) IMPLANT
CATH THORACIC 36FR RT ANG (CATHETERS) IMPLANT
CLEANER TIP ELECTROSURG 2X2 (MISCELLANEOUS) ×2 IMPLANT
CLIP APPLIE ROT 10 11.4 M/L (STAPLE) IMPLANT
CLIP TI MEDIUM 6 (CLIP) IMPLANT
CONN Y 3/8X3/8X3/8  BEN (MISCELLANEOUS) ×1
CONN Y 3/8X3/8X3/8 BEN (MISCELLANEOUS) ×1 IMPLANT
CONT SPEC 4OZ CLIKSEAL STRL BL (MISCELLANEOUS) ×16 IMPLANT
COVER SURGICAL LIGHT HANDLE (MISCELLANEOUS) ×2 IMPLANT
DERMABOND ADVANCED (GAUZE/BANDAGES/DRESSINGS) ×1
DERMABOND ADVANCED .7 DNX12 (GAUZE/BANDAGES/DRESSINGS) ×1 IMPLANT
DRAIN CHANNEL 28F RND 3/8 FF (WOUND CARE) IMPLANT
DRAIN CHANNEL 32F RND 10.7 FF (WOUND CARE) IMPLANT
DRAPE LAPAROSCOPIC ABDOMINAL (DRAPES) ×2 IMPLANT
DRAPE WARM FLUID 44X44 (DRAPE) ×2 IMPLANT
ELECT BLADE 6.5 EXT (BLADE) ×2 IMPLANT
ELECT REM PT RETURN 9FT ADLT (ELECTROSURGICAL) ×2
ELECTRODE REM PT RTRN 9FT ADLT (ELECTROSURGICAL) ×1 IMPLANT
GAUZE SPONGE 4X4 12PLY STRL (GAUZE/BANDAGES/DRESSINGS) ×2 IMPLANT
GLOVE BIO SURGEON STRL SZ 6.5 (GLOVE) ×4 IMPLANT
GLOVE SURG SIGNA 7.5 PF LTX (GLOVE) ×4 IMPLANT
GOWN STRL REUS W/ TWL LRG LVL3 (GOWN DISPOSABLE) ×2 IMPLANT
GOWN STRL REUS W/ TWL XL LVL3 (GOWN DISPOSABLE) ×1 IMPLANT
GOWN STRL REUS W/TWL LRG LVL3 (GOWN DISPOSABLE) ×2
GOWN STRL REUS W/TWL XL LVL3 (GOWN DISPOSABLE) ×1
HANDLE UNIV ENDO GIA (ENDOMECHANICALS) ×2 IMPLANT
HEMOSTAT SURGICEL 2X14 (HEMOSTASIS) IMPLANT
KIT BASIN OR (CUSTOM PROCEDURE TRAY) ×2 IMPLANT
KIT ROOM TURNOVER OR (KITS) ×2 IMPLANT
KIT SUCTION CATH 14FR (SUCTIONS) ×2 IMPLANT
NS IRRIG 1000ML POUR BTL (IV SOLUTION) ×4 IMPLANT
PACK CHEST (CUSTOM PROCEDURE TRAY) ×2 IMPLANT
PAD ARMBOARD 7.5X6 YLW CONV (MISCELLANEOUS) ×4 IMPLANT
POUCH ENDO CATCH II 15MM (MISCELLANEOUS) IMPLANT
POUCH SPECIMEN RETRIEVAL 10MM (ENDOMECHANICALS) IMPLANT
RELOAD EGIA 45 MED/THCK PURPLE (STAPLE) ×8 IMPLANT
RELOAD TRI 45 ART MED THCK BLK (STAPLE) ×6 IMPLANT
RELOAD TRI 45 ART MED THCK PUR (STAPLE) ×2 IMPLANT
RELOAD TRI 60 ART MED THCK PUR (STAPLE) ×10 IMPLANT
SEALANT PROGEL (MISCELLANEOUS) IMPLANT
SEALANT SURG COSEAL 4ML (VASCULAR PRODUCTS) IMPLANT
SEALANT SURG COSEAL 8ML (VASCULAR PRODUCTS) IMPLANT
SOLUTION ANTI FOG 6CC (MISCELLANEOUS) ×2 IMPLANT
SPECIMEN JAR MEDIUM (MISCELLANEOUS) ×2 IMPLANT
SPONGE GAUZE 4X4 12PLY STER LF (GAUZE/BANDAGES/DRESSINGS) ×4 IMPLANT
SPONGE INTESTINAL PEANUT (DISPOSABLE) IMPLANT
SPONGE TONSIL 1 RF SGL (DISPOSABLE) ×2 IMPLANT
SUT PROLENE 4 0 RB 1 (SUTURE)
SUT PROLENE 4-0 RB1 .5 CRCL 36 (SUTURE) IMPLANT
SUT SILK  1 MH (SUTURE) ×2
SUT SILK 1 MH (SUTURE) ×2 IMPLANT
SUT SILK 2 0SH CR/8 30 (SUTURE) IMPLANT
SUT SILK 3 0SH CR/8 30 (SUTURE) IMPLANT
SUT VIC AB 1 CTX 36 (SUTURE) ×1
SUT VIC AB 1 CTX36XBRD ANBCTR (SUTURE) ×1 IMPLANT
SUT VIC AB 2-0 CTX 36 (SUTURE) IMPLANT
SUT VIC AB 2-0 UR6 27 (SUTURE) IMPLANT
SUT VIC AB 3-0 MH 27 (SUTURE) IMPLANT
SUT VIC AB 3-0 X1 27 (SUTURE) ×2 IMPLANT
SUT VICRYL 2 TP 1 (SUTURE) IMPLANT
SWAB COLLECTION DEVICE MRSA (MISCELLANEOUS) IMPLANT
SYSTEM SAHARA CHEST DRAIN ATS (WOUND CARE) ×2 IMPLANT
TAPE CLOTH 4X10 WHT NS (GAUZE/BANDAGES/DRESSINGS) ×2 IMPLANT
TAPE CLOTH SURG 4X10 WHT LF (GAUZE/BANDAGES/DRESSINGS) ×4 IMPLANT
TIP APPLICATOR SPRAY EXTEND 16 (VASCULAR PRODUCTS) IMPLANT
TOWEL OR 17X24 6PK STRL BLUE (TOWEL DISPOSABLE) ×2 IMPLANT
TOWEL OR 17X26 10 PK STRL BLUE (TOWEL DISPOSABLE) ×4 IMPLANT
TRAP SPECIMEN MUCOUS 40CC (MISCELLANEOUS) IMPLANT
TRAY FOLEY CATH 16FRSI W/METER (SET/KITS/TRAYS/PACK) ×2 IMPLANT
TROCAR XCEL BLADELESS 5X75MML (TROCAR) ×2 IMPLANT
TROCAR XCEL NON-BLD 5MMX100MML (ENDOMECHANICALS) IMPLANT
TUBE ANAEROBIC SPECIMEN COL (MISCELLANEOUS) IMPLANT
TUNNELER SHEATH ON-Q 11GX8 DSP (PAIN MANAGEMENT) IMPLANT
WATER STERILE IRR 1000ML POUR (IV SOLUTION) ×4 IMPLANT

## 2016-09-17 NOTE — Anesthesia Preprocedure Evaluation (Addendum)
Anesthesia Evaluation  Patient identified by MRN, date of birth, ID band Patient awake    Reviewed: Allergy & Precautions, NPO status , Patient's Chart, lab work & pertinent test results  Airway Mallampati: II  TM Distance: >3 FB Neck ROM: Full    Dental no notable dental hx.    Pulmonary COPD, former smoker,  Spontaneous PTX    + decreased breath sounds      Cardiovascular negative cardio ROS Normal cardiovascular exam Rhythm:Regular Rate:Normal     Neuro/Psych negative neurological ROS  negative psych ROS   GI/Hepatic Neg liver ROS,   Endo/Other  negative endocrine ROS  Renal/GU negative Renal ROS  negative genitourinary   Musculoskeletal negative musculoskeletal ROS (+)   Abdominal   Peds negative pediatric ROS (+)  Hematology negative hematology ROS (+)   Anesthesia Other Findings   Reproductive/Obstetrics negative OB ROS                            Anesthesia Physical Anesthesia Plan  ASA: III  Anesthesia Plan: General   Post-op Pain Management:    Induction: Intravenous  Airway Management Planned: Double Lumen EBT  Additional Equipment: Arterial line  Intra-op Plan:   Post-operative Plan: Extubation in OR  Informed Consent: I have reviewed the patients History and Physical, chart, labs and discussed the procedure including the risks, benefits and alternatives for the proposed anesthesia with the patient or authorized representative who has indicated his/her understanding and acceptance.   Dental advisory given  Plan Discussed with: CRNA  Anesthesia Plan Comments:        Anesthesia Quick Evaluation

## 2016-09-17 NOTE — Progress Notes (Signed)
      [address]       [place] [id number]             [phone]      Reintubated in OR, recently extubated again  BP 127/83   Pulse 96   Temp 99.7 F (37.6 C) (Oral)   Resp (!) 23   Ht 6\' 1"  (1.854 m)   Wt 190 lb 7.6 oz (86.4 kg)   SpO2 (!) 89%   BMI 25.13 kg/m    Intake/Output Summary (Last 24 hours) at 09/17/16 1720 Last data filed at 09/17/16 1600  Gross per 24 hour  Intake             2456 ml  Output             1205 ml  Net             1251 ml    No significant air leak  CXR OK  Cartier Mapel C. Dorris FetchHendrickson, MD [facility] [phone]

## 2016-09-17 NOTE — Progress Notes (Addendum)
eLink Physician-Brief Progress Note Patient Name: Marlene LardJoseph T Dolley DOB: 01/20/1956 MRN: [id number]   Date of Service  09/17/2016  HPI/Events of Note  Returned from OR on vent  eICU Interventions  Vent stabilization order set Sedation order set     Intervention Category Major Interventions: Respiratory failure - evaluation and management  Merwyn KatosDavid B Colvin Blatt 09/17/2016, 3:18 PM

## 2016-09-17 NOTE — Progress Notes (Signed)
eLink Physician-Brief Progress Note Patient Name: Jay LardJoseph T Mitchell DOB: 01/17/1956 MRN: [id number]   Date of Service  09/17/2016  HPI/Events of Note  Review of CXR reveals a small residual L pneumothorax which appears to be smaller than the previous CXR.   eICU Interventions  Will place L chest tube to -20 cm H2O suction.     Intervention Category Intermediate Interventions: Respiratory distress - evaluation and management  Sommer,Steven Eugene 09/17/2016, 4:32 AM

## 2016-09-17 NOTE — Progress Notes (Signed)
eLink Physician-Brief Progress Note Patient Name: Jay LardJoseph T Mitchell DOB: 06/28/1956 MRN: [id number]   Date of Service  09/17/2016  HPI/Events of Note  Did not tolerate going to -20 cm H2O suction. Sats and pain worse.   eICU Interventions  Will place L chest tube back on water seal.      Intervention Category Major Interventions: Hypoxemia - evaluation and management  Mohini Heathcock Eugene 09/17/2016, 4:55 AM

## 2016-09-17 NOTE — Progress Notes (Signed)
Pt transported to short stay area and verbal report given to receiving nurse Pt placed on cardiac monitor, 02 and wife at bedside.

## 2016-09-17 NOTE — Procedures (Addendum)
Extubation Procedure Note  Patient Details:   Name: Jay Mitchell DOB: 07/23/1956 MRN: [id number]   Airway Documentation:     Evaluation  O2 sats: stable throughout Complications: No apparent complications Patient did tolerate procedure well. Bilateral Breath Sounds: Diminished, Clear   Yes   Positive cuff leak prior to extubation. Pt placed on [place] 4 Lpm, sat dropped to 78%, RT placed pt on Venturi Mask 14 L 55%, sat improved to 88%.  MD aware.  Rayburn FeltJean S Nicol Herbig 09/17/2016, 4:31 PM

## 2016-09-17 NOTE — Progress Notes (Signed)
eLink Physician-Brief Progress Note Patient Name: Jay LardJoseph T Mitchell DOB: 04/03/1956 MRN: [id number]   Date of Service  09/17/2016  HPI/Events of Note  Bedside nurse reports increased air leak and increased chest pain.   eICU Interventions  Will get 5 AM portable CXR now to be sure that pneumothorax is adequately decompressed.      Intervention Category Intermediate Interventions: Pain - evaluation and management  Jay Mitchell 09/17/2016, 4:08 AM

## 2016-09-17 NOTE — Progress Notes (Signed)
Name: Jay LardJoseph T Mitchell MRN:   [id number] DOB:   08/12/1956           ADMISSION DATE:  09/16/2016 CONSULTATION DATE:  11/7                        REFERRING MD:  Transfer IN  CHIEF COMPLAINT:  PTX , leak , COPD  Brief:   60 yr old h/o COPD, remote PNA, admitted 11/6 for abrupt SOB when getting out of bed. To hospital noted spont large PTX with total lung collapse. A small CT was placed then a larger and small removed. Chest tube continued to leak excessive air and was referred to [facility] for pul/ CVTS specialty care. No fevers. No CP After CT placement lung cam eup but remains with PTX moderate with leak described.  SUBJECTIVE:  Improved breathing overall  VITAL SIGNS: BP 128/89 (BP Location: Right Arm)   Pulse (!) 110   Temp 98.2 F (36.8 C) (Oral)   Resp (!) 22   Ht 6\' 1"  (1.854 m)   Wt 84.5 kg (186 lb 4.6 oz)   SpO2 90%   BMI 24.58 kg/m   HEMODYNAMICS:    VENTILATOR SETTINGS:    INTAKE / OUTPUT: No intake/output data recorded.  PHYSICAL EXAMINATION: General:  Awake, no distress Neuro: alert and oriented, follows commands  HEENT:  jvd present wnl Cardiovascular:  s1 s2 RRR, no r/g,  Lungs:  Distant, increased work of breathing, left chest tube in place   Abdomen:  Soft, active bowel sounds  Musculoskeletal:  No edema Skin:  warm, dry, intact   LABS:  BMET Last Labs   No results for input(s): NA, K, CL, CO2, BUN, CREATININE, GLUCOSE in the last 168 hours.    Electrolytes Last Labs   No results for input(s): CALCIUM, MG, PHOS in the last 168 hours.    CBC Last Labs   No results for input(s): WBC, HGB, HCT, PLT in the last 168 hours.    Coag's Last Labs   No results for input(s): APTT, INR in the last 168 hours.    Sepsis Markers Last Labs   No results for input(s): LATICACIDVEN, PROCALCITON, O2SATVEN in the last 168 hours.    ABG Last Labs   No results for input(s): PHART, PCO2ART, PO2ART in the last 168 hours.    Liver  Enzymes Last Labs   No results for input(s): AST, ALT, ALKPHOS, BILITOT, ALBUMIN in the last 168 hours.    Cardiac Enzymes Last Labs   No results for input(s): TROPONINI, PROBNP in the last 168 hours.    Glucose Last Labs   No results for input(s): GLUCAP in the last 168 hours.    Imaging Dg Chest Port 1 View  Result Date: 09/16/2016 CLINICAL DATA:  Left side chest tubes EXAM: PORTABLE CHEST 1 VIEW COMPARISON:  None. FINDINGS: Cardiomediastinal silhouette is unremarkable. A left basilar chest tube is noted. There is about 20% left upper and left lateral pneumothorax. Atelectasis noted bilateral basilar. IMPRESSION: Left basilar chest tube. About 20% left pneumothorax. Bilateral basilar atelectasis. Electronically Signed   By: Natasha MeadLiviu  Pop M.D.   On: 09/16/2016 17:12     STUDIES:  CT chest 11/6>>>large PTX, totoal left collapse from PTX, extensive COPD CT Chest 11/6 >> Persistent left pneumothorax relatively stable from the prior chest x-ray. Chest tube is noted in satisfactory position. Severe emphysematous changes as described. Bibasilar infiltrates worse on the left accentuated by the pneumothorax.  CULTURES:  ANTIBIOTICS:   SIGNIFICANT EVENTS: 11/6- PTX spont, large with collapse, CT placed 11/7- no resolution of PTX fully with large leak, [facility] OR for BLEBECTOMY, apical pleurectomy, mechanical pleural abrasion   LINES/TUBES: 11/6 outside hospt left CT>>>1/8 in OR 1/8 chest tube in [facility] at [facility] left>>> 1/8 ETT (re intubated pacu)>>>  ASSESSMENT / PLAN:  PULMONARY A: LArge PTX spont S/p BLEBECTOMY, apical pleurectomy, mechanical pleural abrasion advances Bolous COPD P:   cvta to OR, see attending note IS  CARDIOVASCULAR A:  Tachy resolved P:  Tele  RENAL A:   Prior normal CRT P:   Strict I &O  Trend BMP  Replace electrolytes as needed   GASTROINTESTINAL A:   R/o GERD P:   PPI  NPO  HEMATOLOGIC A:   Secondary  polycythemia P:  Trend CBC scd  INFECTIOUS A:   No infection present P:   Follow temp and WBC trend   ENDOCRINE A:   At risk hyperglcyemia P:   SSI  Q4H Glucose checks   NEUROLOGIC A:   pain P:   RASS goal: 0 Consider prn fent   FAMILY  - Updates: to pt and all family in room  - Inter-disciplinary family meet or Palliative Care meeting due by: 11/14   Jay Mitchell, AG-ACNP [facility]  Pgr: [phone]  PCCM Pgr: [phone]  STAFF NOTE: Jay Mitchell, Jay Fernandez, MD FACP have personally reviewed patient's available data, including medical history, events of note, physical examination and test results as part of my evaluation. I have discussed with resident/NP and other care providers such as pharmacist, RN and RRT. In addition, I personally evaluated patient and elicited key findings of: post op now in 2S s/p VATS BLEBECTOMY, apical pleurectomy, mechanical pleural abrasion secondary to persitant leak and future risk for continued leak, bleb eruption, lungs distant, chest tube in place, does follow simple commands, abdo soft, he was hypoxic post op and re intubated  By anesthesia, on rest would assess ABG on current 8 cc/kg, would prefer peep 3 if able, obtain abg if remain on rest, he is more awake, would likely to move to SBT cpap 5 ps 5 goal 30 min and assess abg , pcxr all reviewed lung is up post op, WUA now, off all continuous sedation, albuterol neb needed, family is at the bedside and I have updated them, goal is extubation I hope his neurostatus would improve as we recover him in ICU, chest tube management per cvts, when comfortable would like ot re add sub q heparin if remains on vent, we can utilize precedex if remains on vent overnight, also he was on GERD treatment , now on vent will add back ensure The patient is critically ill with multiple organ systems failure and requires high complexity decision making for assessment and support,  frequent evaluation and titration of therapies, application of advanced monitoring technologies and extensive interpretation of multiple databases.   Critical Care Time devoted to patient care services described in this note is 35 Minutes. This time reflects time of care of this signee: Jay Percyaniel Nasreen Goedecke, MD FACP. This critical care time does not reflect procedure time, or teaching time or supervisory time of PA/NP/Med student/Med Resident etc but could involve care discussion time. Rest per NP/medical resident whose note is outlined above and that I agree with   Jay Rossettianiel J. Tyson AliasFeinstein, MD, FACP Pgr: [phone] [facility] 09/17/2016 3:48 PM

## 2016-09-17 NOTE — Progress Notes (Addendum)
      [address]       [place] [id number]             [phone]           Subjective: Patient states breathing is better. Has pain at left chest tube site.  Objective: Vital signs in last 24 hours: Temp:  [97.2 F (36.2 C)-99.2 F (37.3 C)] 97.4 F (36.3 C) (11/08 0745) Pulse Rate:  [94-115] 99 (11/08 0700) Cardiac Rhythm: Normal sinus rhythm (11/08 0400) Resp:  [14-27] 22 (11/08 0700) BP: (107-142)/(68-89) 120/77 (11/08 0700) SpO2:  [88 %-97 %] 95 % (11/08 0809) Weight:  [186 lb 4.6 oz (84.5 kg)-190 lb 7.6 oz (86.4 kg)] 190 lb 7.6 oz (86.4 kg) (11/08 0500)     Intake/Output from previous day: 11/07 0701 - 11/08 0700 In: 414.3 [P.O.:240; I.V.:174.3] Out: 450 [Urine:400; Chest Tube:50]   Physical Exam:  Cardiovascular: RRR Pulmonary: Diminished left apex Wounds: Dressing is clean and dry.  Chest Tube: to suction, 3+ air leak  Lab Results: CBC: Recent Labs  09/16/16 1703 09/17/16 0215  WBC 20.1* 14.8*  HGB 16.1 14.5  HCT 45.7 42.7  PLT 240 241   BMET:  Recent Labs  09/16/16 1703 09/17/16 0215  NA 137 137  K 4.4 4.5  CL 105 102  CO2 21* 25  GLUCOSE 127* 103*  BUN 14 15  CREATININE 1.02 1.00  CALCIUM 9.0 8.8*    PT/INR:  Recent Labs  09/16/16 1703  LABPROT 14.0  INR 1.08   ABG:  INR: Will add last result for INR, ABG once components are confirmed Will add last 4 CBG results once components are confirmed  Assessment/Plan:   Pulmonary - Chest tube is to suction and there is a 3+ air leak. CXR this am shows decreased left pneumothorax and lung still not completely re expanded.CT showed severe emphysematous changes are noted with multiple blebs and bulla particularly in the upper lobes bilaterally. Some mild infiltrative changes are noted in the right lower lobe as well as more marked consolidation in the left lower lobe. This is likely related to the known pneumothorax. Left thoracostomy catheter is noted in  satisfactory position although the lung has not shown complete re-expansion. The pneumothorax was likely related to a ruptured bleb. Patient would likely benefit from VATS, stapling of blebs. Dr. Cornelius Moraswen to evaluate.  ZIMMERMAN,DONIELLE MPA-C 09/17/2016,8:57 AM  I have seen and examined the patient and agree with the assessment and plan as outlined.  Patient has significant persistent air leak with tube on water seal.  Chest CT reveals existing tube in good position but severe parenchymal lung disease due to COPD with large blebs in both lungs. Today's CXR reveals further decrease in size of residual PTX but increased consolidation in LLL suggestive of acute lung injury versus pneumonia.  I suspect the best course of action is to proceed with surgery for left VATS, possible thoracotomy for bleb resection and mechanical pleurodesis.  Alternatively we could leave chest tube in place on water seal indefinitely, but likelihood of good long term result without surgery is probably poor.  Risks of surgery will be considerable, with concerns for persistent air leak, pneumonia, possible respiratory failure.  I discussed options at length with the patient and his wife at bedside.  Will look into possibility of proceeding with surgery today.  All questions answered.  Purcell Nailslarence H Owen, MD 09/17/2016 9:29 AM

## 2016-09-17 NOTE — H&P (View-Only) (Signed)
[address]       [place] [id number]             [phone]          CARDIOTHORACIC SURGERY CONSULTATION REPORT  PCP is No primary care provider on file. Referring Provider is SIMONDS, DAVID, MD   Reason for consultation:  Spontaneous pneumothorax  HPI:  Patient is a 60 year old male with reported history of COPD, remote history of tobacco use, and GE reflux disease who was in his usual state of health until early yesterday morning when he developed sudden onset of left-sided chest pain and shortness of breath. At the time he was traveling in the [place] for business where he was attending a conference. He was evaluated in the emergency department there and found to have a large left pneumothorax with complete collapse of the left lung. A small bore chest tube was placed but the lung only partially reexpanded. Subsequently the initial chest tube was removed and the larger chest tube placed by a surgeon. The patient was noted to have persistent air leak, and at the patient's request he was transferred [facility] for further management. Cardiothoracic surgical consultation was requested upon arrival.  The patient is married and lives [place] his [place] locally in [place]. 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 He has a remote history of tobacco abuse but he quit smoking in 2004. He states that approximately 10 years ago he suffered from a case of pneumonia, and he was told that chest x-ray obtained at that time revealed findings consistent with COPD. He has remained physically active throughout his adult life although he does not exercise on a regular basis. He reports no significant physical limitations prior to acute onset of chest pain and shortness of breath that occurred early yesterday morning. He specifically denies any problems with shortness of breath, wheezing,  or frequent bouts of bronchitis prior to admission.  The patient has no previous history of spontaneous pneumothorax and he specifically denies any recent history of trauma.  He states that initially after chest tube placement he had fairly severe pain across left side of his chest. Pain has improved considerably over the last 6-12 hours and at present he has minimal discomfort associated with the tube. The patient denies any shortness of breath at present and reports that his breathing is much better than it was prior to initial chest tube placement.  Past Medical History:  Diagnosis Date  . COPD (chronic obstructive pulmonary disease) (HCC)   . GERD (gastroesophageal reflux disease)   . Spontaneous pneumothorax 09/15/2016   left    History reviewed. No pertinent surgical history.  History reviewed. No pertinent family history.  Social History   Social History  . Marital status: Married    Spouse name: N/A  . Number of children: N/A  . Years of education: N/A   Occupational History  . Not on file.   Social History Main Topics  . Smoking status: Former Smoker    Packs/day: 1.00    Types: Cigarettes    Quit date: 2004  . Smokeless tobacco: Never Used  . Alcohol use No  . Drug use: No  . Sexual activity: Not on file   Other Topics Concern  . Not on file   Social History Narrative  . No narrative on file    Prior to Admission medications  Not on File    Current Facility-Administered Medications  Medication Dose Route Frequency Provider Last Rate Last Dose  . acetaminophen (TYLENOL) tablet 650 mg  650 mg Oral Q4H PRN Merwyn Katosavid B Simonds, MD      . albuterol (PROVENTIL) (2.5 MG/3ML) 0.083% nebulizer solution 2.5 mg  2.5 mg Nebulization Q2H PRN Merwyn Katosavid B Simonds, MD      . fentaNYL (SUBLIMAZE) injection 25 mcg  25 mcg Intravenous Q2H PRN Nelda Bucksaniel J Feinstein, MD      . insulin aspart (novoLOG) injection 0-9 Units  0-9 Units Subcutaneous Q4H Nelda Bucksaniel J Feinstein, MD      .  ipratropium-albuterol (DUONEB) 0.5-2.5 (3) MG/3ML nebulizer solution 3 mL  3 mL Nebulization Q6H Merwyn Katosavid B Simonds, MD      . lactated ringers infusion   Intravenous Continuous Purcell Nailslarence H Heloise Gordan, MD 50 mL/hr at 09/16/16 1650    . ondansetron (ZOFRAN) injection 4 mg  4 mg Intravenous Q6H PRN Merwyn Katosavid B Simonds, MD      . Melene Muller[START ON 09/17/2016] pantoprazole (PROTONIX) EC tablet 40 mg  40 mg Oral Q1200 Nelda Bucksaniel J Feinstein, MD        Allergies  Allergen Reactions  . Meperidine Hcl       Review of Systems:   General:  normal appetite, normal energy, no weight gain, no weight loss, no fever  Cardiac:  no chest pain with exertion, + chest pain at rest which began yesterday, no SOB with exertion, + resting SOB which began yesterday, no PND, no orthopnea, no palpitations, no arrhythmia, no atrial fibrillation, no LE edema, no dizzy spells, no syncope  Respiratory:  + shortness of breath, no home oxygen, no productive cough, no dry cough, no bronchitis, n wheezing, no hemoptysis, no asthma, + pain with inspiration or cough, no sleep apnea, no CPAP at night  GI:   no difficulty swallowing, + reflux, no frequent heartburn, no hiatal hernia, no abdominal pain, no constipation, no diarrhea, no hematochezia, no hematemesis, no melena  GU:   no dysuria,  no frequency, no urinary tract infection, no hematuria, no enlarged prostate, + kidney stones, no kidney disease  Vascular:  no pain suggestive of claudication, no pain in feet, no leg cramps, no varicose veins, no DVT, no non-healing foot ulcer  Neuro:   no stroke, no TIA's, no seizures, no headaches, no temporary blindness one eye,  no slurred speech, no peripheral neuropathy, no chronic pain, no instability of gait, no memory/cognitive dysfunction  Musculoskeletal: no arthritis, no joint swelling, + myalgias, no difficulty walking, normal mobility   Skin:   no rash, no itching, no skin infections, no pressure sores or ulcerations  Psych:   no anxiety, no  depression, no nervousness, no unusual recent stress  Eyes:   no blurry vision, no floaters, no recent vision changes, + wears glasses or contacts  ENT:   no hearing loss, no loose or painful teeth, no dentures  Hematologic:  no easy bruising, no abnormal bleeding, no clotting disorder, no frequent epistaxis  Endocrine:  no diabetes, does not check CBG's at home     Physical Exam:   BP 128/89 (BP Location: Right Arm)   Pulse (!) 110   Temp 98.2 F (36.8 C) (Oral)   Resp (!) 22   Ht 6\' 1"  (1.854 m)   Wt 186 lb 4.6 oz (84.5 kg)   SpO2 90%   BMI 24.58 kg/m   General:    well-appearing  HEENT:  Unremarkable   Neck:  no JVD, no bruits, no adenopathy   Chest:   clear to auscultation, symmetrical breath sounds, no wheezes, no rhonchi  Chest tube:  Single chest tube in place left chest, + small air leak with tube on water seal  CV:   RRR, no  murmur   Abdomen:  soft, non-tender, no masses   Extremities:  warm, well-perfused, pulses palpable, no lower extremity edema  Rectal/GU  Deferred  Neuro:   Grossly non-focal and symmetrical throughout  Skin:   Clean and dry, no rashes, no breakdown  Diagnostic Tests:  PORTABLE CHEST 1 VIEW  COMPARISON:  None.  FINDINGS: Cardiomediastinal silhouette is unremarkable. A left basilar chest tube is noted. There is about 20% left upper and left lateral pneumothorax. Atelectasis noted bilateral basilar.  IMPRESSION: Left basilar chest tube. About 20% left pneumothorax. Bilateral basilar atelectasis.   Electronically Signed   By: Natasha Mead M.D.   On: 09/16/2016 17:12    Impression:  Patient with reported history of COPD and remote history of tobacco abuse presents with first time primary spontaneous pneumothorax on the left side.  Chest tube was placed at outside hospital and clinically the patient appears quite stable and reports feeling much improved. He has a small persistent air leak with the tube on water seal. Chest x-ray  demonstrates reexpansion of the left lung with approximately 20% residual left apical pneumothorax. The tube may be in the major fissure, although position is difficult to tell for certain on the existing portable chest x-ray.   Plan:  Will proceed with chest CT scan to verify position of the existing chest tube and further evaluate anatomical characteristics of the patient's underlying COPD. Depending upon findings we will discuss treatment options further.   I spent in excess of 60 minutes during the conduct of this hospital consultation and >50% of this time involved direct face-to-face encounter for counseling and/or coordination of the patient's care.   Salvatore Decent. Cornelius Moras, MD 09/16/2016 5:54 PM

## 2016-09-17 NOTE — Progress Notes (Signed)
Patient re-intubated in OR and placed on vent in PACU. PRVC 650, 12, 50%, +5. 7.5 ETT at 24 secured with pink tape. Patient transported to 2S, and report called to Alvino BloodSue Palmer,RRT. RT to monitor as needed.

## 2016-09-17 NOTE — Anesthesia Postprocedure Evaluation (Signed)
Anesthesia Post Note  Patient: Jay Mitchell  Procedure(s) Performed: Procedure(s) (LRB): VIDEO ASSISTED THORACOSCOPY multiple/BLEBECTOMY, apical pleurectomy, mechanical pleural abrasion (Left)  Patient location during evaluation: SICU Anesthesia Type: General Level of consciousness: sedated Pain management: pain level controlled Vital Signs Assessment: post-procedure vital signs reviewed and stable Respiratory status: patient remains intubated per anesthesia plan Cardiovascular status: stable Anesthetic complications: no    Last Vitals:  Vitals:   09/17/16 1437 09/17/16 1452  BP: 112/65 116/73  Pulse: 85 86  Resp: 12 14  Temp:      Last Pain:  Vitals:   09/17/16 1453  TempSrc:   PainSc: 0-No pain                 Saraiah Bhat DANIEL

## 2016-09-17 NOTE — Transfer of Care (Signed)
Immediate Anesthesia Transfer of Care Note  Patient: Marlene LardJoseph T Wray  Procedure(s) Performed: Procedure(s): VIDEO ASSISTED THORACOSCOPY multiple/BLEBECTOMY, apical pleurectomy, mechanical pleural abrasion (Left)  Patient Location: PACU  Anesthesia Type:General  Level of Consciousness: sedated and Patient remains intubated per anesthesia plan  Airway & Oxygen Therapy: Patient remains intubated per anesthesia plan and Patient placed on Ventilator (see vital sign flow sheet for setting)  Post-op Assessment: Report given to RN and Post -op Vital signs reviewed and stable  Post vital signs: Reviewed and stable  Last Vitals:  Vitals:   09/17/16 0900 09/17/16 1422  BP: 128/81   Pulse: (!) 110   Resp: (!) 24   Temp:  (P) 36.4 C    Last Pain:  Vitals:   09/17/16 0900  TempSrc:   PainSc: 2       Patients Stated Pain Goal: 0 (09/16/16 1626)  Complications: No apparent anesthesia complications

## 2016-09-17 NOTE — Interval H&P Note (Signed)
History and Physical Interval Note:  Asked by Dr. Cornelius Moraswen to see patient and do Left VATS, blebectomy  He is a 60 yo former smoker with a history of COPD. No cardiac history. He developed a left spontaneous pneumothorax [place] chest [place] and [facility] was [facility]. A CXR showed a residual pneumothorax. A CT chest showed severe bullous empysematous disease bilaterally. He is currently stable on [place] O2.  I discussed the natural history of spontaneous pneumothorax with [name]. He is at high risk for a persistent air leak and even if the leak stops is almost certain to have recurrent pneumothoraces. Best option is to proceed with VATS blebectomy and pleural abrasion. I informed them of the general nature of the procedure, the need for general anesthesia, the use of a chest tube postop, the expected stay and overall recovery. They understand the risks include but are not limited to death, MI, stroke, DVT, PE, bleeding, possible need for transfusion, infection, prolonged air leak, cardiac arrhythmias as well as the possibility of other unforeseeable complications. Operation does not eliminate the possibility of a recurrence. He accepts the risks and agrees to proceed.  Jay DecentSteven C. Dorris FetchHendrickson, MD [facility] [phone]  09/17/2016 9:53 AM  Jay Mitchell  has presented today for surgery, with the diagnosis of Left lung blebs  The various methods of treatment have been discussed with the patient and family. After consideration of risks, benefits and other options for treatment, the patient has consented to  Procedure(s): VIDEO ASSISTED THORACOSCOPY/BLEBECTOMY (Left) as a surgical intervention .  The patient's history has been reviewed, patient examined, no change in status, stable for surgery.  I have reviewed the patient's chart and labs.  Questions were answered to the patient's satisfaction.     Loreli SlotSteven C Alida Greiner

## 2016-09-17 NOTE — Brief Op Note (Addendum)
09/16/2016 - 09/17/2016  1:16 PM  PATIENT:  Jay Mitchell  60 y.o. male  PRE-OPERATIVE DIAGNOSIS:  Left spontaneous pneumothorax due to ruptured bleb  POST-OPERATIVE DIAGNOSIS:  Left spontaneous pneumothorax due to ruptured bleb  PROCEDURE:  Procedure(s): VIDEO ASSISTED THORACOSCOPY multiple/BLEBECTOMY, apical pleurectomy, mechanical pleural abrasion (Left)  SURGEON:  Surgeon(s) and Role:    * Loreli SlotSteven C Alaiza Yau, MD - Primary  PHYSICIAN ASSISTANT:  Jari Favreessa Conte, PA-C  ANESTHESIA:   general  EBL:  Total I/O In: 1600 [I.V.:1600] Out: 605 [Urine:540; Blood:65]  BLOOD ADMINISTERED:none  DRAINS: 28 F CT  LOCAL MEDICATIONS USED:  NONE  SPECIMEN:  Source of Specimen:  right upper lobe blebs  DISPOSITION OF SPECIMEN:  PATHOLOGY  COUNTS:  YES  PLAN OF CARE: Admit to inpatient   PATIENT DISPOSITION:  ICU - extubated and stable.   Delay start of Pharmacological VTE agent (>24hrs) due to surgical blood loss or risk of bleeding: yes

## 2016-09-18 ENCOUNTER — Inpatient Hospital Stay (HOSPITAL_COMMUNITY): Payer: 59

## 2016-09-18 ENCOUNTER — Encounter (HOSPITAL_COMMUNITY): Payer: Self-pay | Admitting: Thoracic Surgery (Cardiothoracic Vascular Surgery)

## 2016-09-18 LAB — POCT I-STAT 3, ART BLOOD GAS (G3+)
Acid-Base Excess: 1 mmol/L (ref 0.0–2.0)
BICARBONATE: 26.4 mmol/L (ref 20.0–28.0)
O2 Saturation: 90 %
PCO2 ART: 43.2 mmHg (ref 32.0–48.0)
TCO2: 28 mmol/L (ref 0–100)
pH, Arterial: 7.392 (ref 7.350–7.450)
pO2, Arterial: 58 mmHg — ABNORMAL LOW (ref 83.0–108.0)

## 2016-09-18 LAB — GLUCOSE, CAPILLARY
GLUCOSE-CAPILLARY: 146 mg/dL — AB (ref 65–99)
GLUCOSE-CAPILLARY: 109 mg/dL — AB (ref 65–99)
Glucose-Capillary: 137 mg/dL — ABNORMAL HIGH (ref 65–99)
Glucose-Capillary: 116 mg/dL — ABNORMAL HIGH (ref 65–99)
Glucose-Capillary: 117 mg/dL — ABNORMAL HIGH (ref 65–99)

## 2016-09-18 LAB — BASIC METABOLIC PANEL
Anion gap: 6 (ref 5–15)
BUN: 12 mg/dL (ref 6–20)
CHLORIDE: 101 mmol/L (ref 101–111)
CO2: 27 mmol/L (ref 22–32)
Calcium: 8.3 mg/dL — ABNORMAL LOW (ref 8.9–10.3)
Creatinine, Ser: 0.83 mg/dL (ref 0.61–1.24)
GFR calc non Af Amer: >60 mL/min (ref >60)
Glucose, Bld: 151 mg/dL — ABNORMAL HIGH (ref 65–99)
POTASSIUM: 4.1 mmol/L (ref 3.5–5.1)
SODIUM: 134 mmol/L — AB (ref 135–145)

## 2016-09-18 LAB — CBC
HCT: 37.9 % — ABNORMAL LOW (ref 39.0–52.0)
Hemoglobin: 13 g/dL (ref 13.0–17.0)
MCH: 30.9 pg (ref 26.0–34.0)
MCHC: 34.3 g/dL (ref 30.0–36.0)
MCV: 90 fL (ref 78.0–100.0)
Platelets: 237 10*3/uL (ref 150–400)
RBC: 4.21 MIL/uL — ABNORMAL LOW (ref 4.22–5.81)
RDW: 12.2 % (ref 11.5–15.5)
WBC: 17.2 10*3/uL — ABNORMAL HIGH (ref 4.0–10.5)

## 2016-09-18 LAB — OSMOLALITY, URINE: OSMOLALITY UR: 609 mosm/kg (ref 300–900)

## 2016-09-18 MED ORDER — FUROSEMIDE 10 MG/ML IJ SOLN
20.0000 mg | Freq: Once | INTRAMUSCULAR | Status: AC
  Administered 2016-09-18: 20 mg via INTRAVENOUS
  Filled 2016-09-18: qty 2

## 2016-09-18 MED ORDER — ALBUMIN HUMAN 25 % IV SOLN
12.5000 g | Freq: Once | INTRAVENOUS | Status: AC
  Administered 2016-09-18: 12.5 g via INTRAVENOUS
  Filled 2016-09-18: qty 50

## 2016-09-18 MED ORDER — SODIUM CHLORIDE 0.9 % IV SOLN
INTRAVENOUS | Status: DC
  Administered 2016-09-18: 08:00:00 via INTRAVENOUS

## 2016-09-18 MED ORDER — INSULIN ASPART 100 UNIT/ML ~~LOC~~ SOLN
0.0000 [IU] | Freq: Three times a day (TID) | SUBCUTANEOUS | Status: DC
  Administered 2016-09-20 – 2016-09-21 (×2): 2 [IU] via SUBCUTANEOUS
  Administered 2016-09-21: 0 [IU] via SUBCUTANEOUS
  Administered 2016-09-23: 2 [IU] via SUBCUTANEOUS

## 2016-09-18 MED ORDER — ACETAMINOPHEN 160 MG/5ML PO SOLN
1000.0000 mg | Freq: Four times a day (QID) | ORAL | Status: AC
  Filled 2016-09-18: qty 40.6

## 2016-09-18 MED ORDER — ALBUTEROL SULFATE (2.5 MG/3ML) 0.083% IN NEBU
2.5000 mg | INHALATION_SOLUTION | Freq: Four times a day (QID) | RESPIRATORY_TRACT | Status: DC
  Administered 2016-09-18 – 2016-09-19 (×5): 2.5 mg via RESPIRATORY_TRACT
  Filled 2016-09-18 (×5): qty 3

## 2016-09-18 MED ORDER — ACETAMINOPHEN 500 MG PO TABS
1000.0000 mg | ORAL_TABLET | Freq: Four times a day (QID) | ORAL | Status: AC
  Administered 2016-09-18 – 2016-09-22 (×17): 1000 mg via ORAL
  Filled 2016-09-18 (×16): qty 2

## 2016-09-18 MED ORDER — KETOROLAC TROMETHAMINE 30 MG/ML IJ SOLN
30.0000 mg | Freq: Four times a day (QID) | INTRAMUSCULAR | Status: AC
  Administered 2016-09-18 – 2016-09-19 (×8): 30 mg via INTRAVENOUS
  Filled 2016-09-18 (×8): qty 1

## 2016-09-18 MED ORDER — ENOXAPARIN SODIUM 40 MG/0.4ML ~~LOC~~ SOLN
40.0000 mg | SUBCUTANEOUS | Status: DC
  Administered 2016-09-18 – 2016-09-23 (×6): 40 mg via SUBCUTANEOUS
  Filled 2016-09-18 (×6): qty 0.4

## 2016-09-18 NOTE — Progress Notes (Signed)
1 Day Post-Op Procedure(s) (LRB): VIDEO ASSISTED THORACOSCOPY multiple/BLEBECTOMY, apical pleurectomy, mechanical pleural abrasion (Left) Subjective: C/o pain and frequent cough overnight  Objective: Vital signs in last 24 hours: Temp:  [97.5 F (36.4 C)-99.7 F (37.6 C)] 98.6 F (37 C) (11/09 0722) Pulse Rate:  [79-110] 84 (11/09 0800) Cardiac Rhythm: Normal sinus rhythm (11/09 0747) Resp:  [11-24] 20 (11/09 0800) BP: (99-137)/(61-93) 104/75 (11/09 0800) SpO2:  [88 %-99 %] 94 % (11/09 0800) Arterial Line BP: (70-161)/(54-71) 142/62 (11/09 0800) FiO2 (%):  [50 %-55 %] 50 % (11/09 0400)  Hemodynamic parameters for last 24 hours:    Intake/Output from previous day: 11/08 0701 - 11/09 0700 In: 4065 [P.O.:240; I.V.:3725; IV Piggyback:100] Out: 1815 [Urine:1365; Blood:65; Chest Tube:385] Intake/Output this shift: Total I/O In: 10 [I.V.:10] Out: 100 [Urine:100]  General appearance: alert, cooperative and mild distress Neurologic: intact Heart: regular rate and rhythm Lungs: diminished breath sounds bilaterally no air leak  Lab Results:  Recent Labs  09/17/16 0215 09/18/16 0422  WBC 14.8* 17.2*  HGB 14.5 13.0  HCT 42.7 37.9*  PLT 241 237   BMET:  Recent Labs  09/17/16 0215 09/18/16 0422  NA 137 134*  K 4.5 4.1  CL 102 101  CO2 25 27  GLUCOSE 103* 151*  BUN 15 12  CREATININE 1.00 0.83  CALCIUM 8.8* 8.3*    PT/INR:  Recent Labs  09/16/16 1703  LABPROT 14.0  INR 1.08   ABG    Component Value Date/Time   PHART 7.392 09/18/2016 0412   HCO3 26.4 09/18/2016 0412   TCO2 28 09/18/2016 0412   O2SAT 90.0 09/18/2016 0412   CBG (last 3)   Recent Labs  09/17/16 2328 09/18/16 0328 09/18/16 0718  GLUCAP 150* 146* 137*    Assessment/Plan: S/P Procedure(s) (LRB): VIDEO ASSISTED THORACOSCOPY multiple/BLEBECTOMY, apical pleurectomy, mechanical pleural abrasion (Left) POD # 1 blebectomy for pneumothorax  CV- stable  RESP- s/p blebectomy, no air leak-  keep CT to suction today  Acute on chronic respiratory failure- still on relatively high level of O2  Nebs, pulmonary hygiene  RENAL- creatinine normal  ENDO- CBG mildly elevated, CBG/ SSI AC and HS  SCD + enoxaparin for DVT prophylaxis  Mobilize  Pain control- PCA + toradol   LOS: 2 days    Loreli SlotSteven C Keiaira Donlan 09/18/2016

## 2016-09-18 NOTE — Progress Notes (Signed)
RT instructed pt and family on the use of flutter valve. Pt able to demonstrate back good technique. 

## 2016-09-18 NOTE — Progress Notes (Signed)
Titrated from venturi mask 14 L to Salter HFNC 8 L, sat 93%.

## 2016-09-18 NOTE — Progress Notes (Signed)
Name: Jay LardJoseph T Strebeck MRN:   [id number] DOB:   07/28/1956           ADMISSION DATE:  09/16/2016 CONSULTATION DATE:  11/7                        REFERRING MD:  Transfer IN  CHIEF COMPLAINT:  PTX , leak , COPD  Brief:   60 yr old h/o COPD, remote PNA, admitted 11/6 for abrupt SOB when getting out of bed. To hospital noted spont large PTX with total lung collapse. A small CT was placed then a larger and small removed. Chest tube continued to leak excessive air and was referred to [facility] for pul/ CVTS specialty care. No fevers. No CP After CT placement lung cam eup but remains with PTX moderate with leak described.  SUBJECTIVE:  Extubated well Remain son 50% pain  VITAL SIGNS: BP 128/89 (BP Location: Right Arm)   Pulse (!) 110   Temp 98.2 F (36.8 C) (Oral)   Resp (!) 22   Ht 6\' 1"  (1.854 m)   Wt 84.5 kg (186 lb 4.6 oz)   SpO2 90%   BMI 24.58 kg/m   HEMODYNAMICS:    VENTILATOR SETTINGS:    INTAKE / OUTPUT: No intake/output data recorded.  PHYSICAL EXAMINATION: General:  Awake, no distress Neuro: alert and oriented, follows commands  HEENT: jvd low Cardiovascular:  s1 s2 RRR, no r/g,  Lungs:  Distant clear, no leak on tube  Abdomen:  Soft, active bowel sounds  Musculoskeletal:  No edema Skin:  warm, dry, intact   LABS:  BMET Last Labs   No results for input(s): NA, K, CL, CO2, BUN, CREATININE, GLUCOSE in the last 168 hours.    Electrolytes Last Labs   No results for input(s): CALCIUM, MG, PHOS in the last 168 hours.    CBC Last Labs   No results for input(s): WBC, HGB, HCT, PLT in the last 168 hours.    Coag's Last Labs   No results for input(s): APTT, INR in the last 168 hours.    Sepsis Markers Last Labs   No results for input(s): LATICACIDVEN, PROCALCITON, O2SATVEN in the last 168 hours.    ABG Last Labs   No results for input(s): PHART, PCO2ART, PO2ART in the last 168 hours.    Liver Enzymes Last Labs   No results for  input(s): AST, ALT, ALKPHOS, BILITOT, ALBUMIN in the last 168 hours.    Cardiac Enzymes Last Labs   No results for input(s): TROPONINI, PROBNP in the last 168 hours.    Glucose Last Labs   No results for input(s): GLUCAP in the last 168 hours.    Imaging Dg Chest Port 1 View  Result Date: 09/16/2016 CLINICAL DATA:  Left side chest tubes EXAM: PORTABLE CHEST 1 VIEW COMPARISON:  None. FINDINGS: Cardiomediastinal silhouette is unremarkable. A left basilar chest tube is noted. There is about 20% left upper and left lateral pneumothorax. Atelectasis noted bilateral basilar. IMPRESSION: Left basilar chest tube. About 20% left pneumothorax. Bilateral basilar atelectasis. Electronically Signed   By: Natasha MeadLiviu  Pop M.D.   On: 09/16/2016 17:12     STUDIES:  CT chest 11/6>>>large PTX, totoal left collapse from PTX, extensive COPD CT Chest 11/6 >> Persistent left pneumothorax relatively stable from the prior chest x-ray. Chest tube is noted in satisfactory position. Severe emphysematous changes as described. Bibasilar infiltrates worse on the left accentuated by the pneumothorax.  CULTURES:  ANTIBIOTICS:  SIGNIFICANT EVENTS: 11/6- PTX spont, large with collapse, CT placed 11/7- no resolution of PTX fully with large leak, [facility] 1[facility]- OR for BLEBECTOMY, apical pleurectomy, mechanical pleural abrasion  1/8- reintubated pacu, extubated in icu  LINES/TUBES: 11/6 outside hospt left CT>>>1/8 in OR 1/8 chest tube in OR at [facility] left>>> 1/8 ETT (re intubated pacu)>>>1/8  ASSESSMENT / PLAN:  PULMONARY A: LArge PTX spont S/p BLEBECTOMY, apical pleurectomy, mechanical pleural abrasion advances Bolous COPD ALI from collapse left improved P:   Goal sat 88%, goal to 6 liters today IS Would attempt high flow nasal cannula if needed pcxr left post collapase initially ALI is better  CARDIOVASCULAR A:  Tachy resolved P:  Tele Control pain  RENAL A:    hyponatremia P:   Strict I &O  Trend BMP  Dc 1/2 NS Add kvo saline May need lasix in future  GASTROINTESTINAL A:   R/o GERD P:   Pepcid , keep Start doet  HEMATOLOGIC A:   Secondary polycythemia P:  Trend CBC scd Add sub q hep when cvts comfortable - unless full ambulation  INFECTIOUS A:   No infection present P:   Follow temp and WBC trend   ENDOCRINE A:   At risk hyperglcyemia P:   SSI  Q4H Glucose checks   NEUROLOGIC A:   pain P:   RASS goal: 0 Consider prn fent vs pca (avoid if able)   FAMILY  - Updates: to pt  - Inter-disciplinary family meet or Palliative Care meeting due by: 11/14  Can transfer to sdu only if CVTS okay  Mcarthur Rossettianiel J. Tyson AliasFeinstein, MD, FACP Pgr: [phone] [facility] 09/18/2016 7:47 AM

## 2016-09-18 NOTE — Progress Notes (Signed)
CT surgery p.m. Rounds  Patient status post left VATS for stapling of blebs, recent spontaneous pneumothorax Oxygen saturation 94% on 8 L nasal cannula Ambulated in hallway Low urine output-will re-dose Lasix later this p.m. No airleak from single chest tube

## 2016-09-18 NOTE — Op Note (Signed)
NAMHarland Mitchell:  [place], [place]                 ACCOUNT NO.:  [id number]  MEDICAL RECORD NO.:  [id number]  LOCATION:  2S16C                        FACILITY:  [facility]  PHYSICIAN:  Salvatore DecentSteven C. Dorris FetchHendrickson, M.D.DATE OF BIRTH:  08/08/1956  DATE OF PROCEDURE:  09/17/2016 DATE OF DISCHARGE:                              OPERATIVE REPORT   PREOPERATIVE DIAGNOSIS:  Left spontaneous pneumothorax secondary to ruptured blebs.  POSTOPERATIVE DIAGNOSIS:  Left spontaneous pneumothorax secondary to ruptured blebs.  PROCEDURE:   Left video-assisted thoracoscopy,  multiple blebectomies of left upper lobe,  apical pleurectomy and mechanical pleural abrasion.  SURGEON:  Salvatore DecentSteven C. Dorris FetchHendrickson, M.D.  ASSISTANT:  Jari Favreessa Conte, P.A.  ANESTHESIA:  General.  FINDINGS:  Severe emphysematous change- upper lobe greater than lower lobe. Large ruptured bleb off anterior aspect of upper lobe.  CLINICAL NOTE:  Mr. Jay Mitchell is a 60 year old man with a remote history of tobacco abuse and COPD.  He developed sudden onset of chest pain and shortness of breath [place]  He went to the emergency room and was found to have a spontaneous pneumothorax.  A chest tube was placed and he was transferred to [facility] for [facility] management.  He was seen in consultation by Dr. Tressie Stalkerlarence Owen.  A CT scan of the chest was performed, which showed severe emphysematous changes bilaterally with particularly severe bulla and blebs in the left upper lobe.  The patient was felt unlikely to have his air leak resolve and was advised to undergo video-assisted thoracoscopy and stapling of blebs.  I agreed with the recommendation and spoke to the patient personally about the indications, risks, benefits, and alternatives.  He understood and accepted the risks and agreed to proceed.  OPERATIVE NOTE:  Mr. Jay Mitchell was brought to the preoperative holding area on September 17, 2016.  Anesthesia placed a central venous access  and arterial blood pressure monitoring line.  He was taken to the operating room, anesthetized and intubated.  A Foley catheter was placed. Sequential compression devices were placed on the calves for DVT prophylaxis.  Intravenous antibiotics were administered.  Single lung ventilation of the right lung was initiated and was tolerated well throughout the procedure.  The left chest tube was removed.  The patient was placed in a right lateral decubitus position and the left chest was prepped and draped in usual sterile fashion.  An incision was made approximately the seventh intercostal space in the midaxillary line.  A 5-mm port was inserted and the thoracoscope was advanced into the chest.  There was good isolation of the left lung. There was a large bleb that was adherent to the previous chest tube site.  This appeared to be the culprit bulla.  A working incision, 5 cm in length, was made in the fourth interspace anterolaterally.  No rib spreading was performed during the procedure.  The ruptured bleb was removed with a single firing of an endoscopic stapler.  Reinforced staple lines were used for all the bleb resections.  There were adhesions to the apex of the chest and these areas were taken down with cautery.  There were severe blebs in that area and again a blebectomy was performed  with sequential firings of the endoscopic stapler with reinforcement. Additional areas with severe thinning of the pleura were also excised. It was impossible to excise all the abnormal lung as there was extensive emphysematous change with nearly the entire upper lobe and a large portion of the lower lobe being involved.  Once all the bleb resections were performed, the chest was filled with warm saline and a test inflation to 30 cm of water showed no significant air leakage.  An apical pleurectomy then was performed.  The remainder of the parietal pleural surface was lightly abraded to promote adhesion  formation.  A 28- French chest tube was placed through the original port incision and directed to the apex.  The left lung was reinflated.  The working incision was closed with a #1 Vicryl fascial suture, the subcutaneous tissue and skin were closed in standard fashion.  The previous chest tube site was loosely closed with two 0 silk sutures.  The chest tube was placed to suction.  The patient then was extubated in the operating room, but was still relatively sedated and not protecting his airway well, his saturations were marginal with 100% oxygen.  He was reintubated and then taken to the postanesthetic care unit in stable condition.     Salvatore DecentSteven C. Dorris FetchHendrickson, M.D.     SCH/MEDQ  D:  09/17/2016  T:  09/18/2016  Job:  [id number]

## 2016-09-18 NOTE — Care Management Note (Signed)
Case Management Note  Patient Details  Name: Jay Mitchell MRN: [id number] Date of Birth: 09/06/1956  Subjective/Objective:      Pt is s/p TAVR              Action/Plan:  PTA independent from home with wife.  CM will continue to follow for discharge needs   Expected Discharge Date:                  Expected Discharge Plan:  Home/Self Care  In-House Referral:     Discharge planning Services  CM Consult  Post Acute Care Choice:    Choice offered to:     DME Arranged:    DME Agency:     HH Arranged:    HH Agency:     Status of Service:  In process, will continue to follow  If discussed at Long Length of Stay Meetings, dates discussed:    Additional Comments:  Cherylann ParrClaxton, Milaina Sher S, RN 09/18/2016, 2:51 PM

## 2016-09-19 ENCOUNTER — Inpatient Hospital Stay (HOSPITAL_COMMUNITY): Payer: 59

## 2016-09-19 DIAGNOSIS — J431 Panlobular emphysema: Secondary | ICD-10-CM

## 2016-09-19 LAB — CBC
HEMATOCRIT: 36.9 % — AB (ref 39.0–52.0)
Hemoglobin: 12.7 g/dL — ABNORMAL LOW (ref 13.0–17.0)
MCH: 30.9 pg (ref 26.0–34.0)
MCHC: 34.4 g/dL (ref 30.0–36.0)
MCV: 89.8 fL (ref 78.0–100.0)
PLATELETS: 231 10*3/uL (ref 150–400)
RBC: 4.11 MIL/uL — ABNORMAL LOW (ref 4.22–5.81)
RDW: 12.3 % (ref 11.5–15.5)
WBC: 13.2 10*3/uL — AB (ref 4.0–10.5)

## 2016-09-19 LAB — GLUCOSE, CAPILLARY
GLUCOSE-CAPILLARY: 109 mg/dL — AB (ref 65–99)
Glucose-Capillary: 108 mg/dL — ABNORMAL HIGH (ref 65–99)
Glucose-Capillary: 112 mg/dL — ABNORMAL HIGH (ref 65–99)
Glucose-Capillary: 106 mg/dL — ABNORMAL HIGH (ref 65–99)

## 2016-09-19 LAB — COMPREHENSIVE METABOLIC PANEL
ALBUMIN: 2.8 g/dL — AB (ref 3.5–5.0)
ALT: 21 U/L (ref 17–63)
AST: 30 U/L (ref 15–41)
Alkaline Phosphatase: 42 U/L (ref 38–126)
Anion gap: 7 (ref 5–15)
BUN: 14 mg/dL (ref 6–20)
CHLORIDE: 101 mmol/L (ref 101–111)
CO2: 28 mmol/L (ref 22–32)
Calcium: 8.2 mg/dL — ABNORMAL LOW (ref 8.9–10.3)
Creatinine, Ser: 0.85 mg/dL (ref 0.61–1.24)
GFR calc Af Amer: >60 mL/min (ref >60)
GFR calc non Af Amer: >60 mL/min (ref >60)
GLUCOSE: 110 mg/dL — AB (ref 65–99)
POTASSIUM: 3.6 mmol/L (ref 3.5–5.1)
Sodium: 136 mmol/L (ref 135–145)
Total Bilirubin: 0.8 mg/dL (ref 0.3–1.2)
Total Protein: 5.9 g/dL — ABNORMAL LOW (ref 6.5–8.1)

## 2016-09-19 MED ORDER — SODIUM CHLORIDE 0.9 % IV SOLN
30.0000 meq | Freq: Every day | INTRAVENOUS | Status: DC | PRN
  Administered 2016-09-19: 30 meq via INTRAVENOUS
  Filled 2016-09-19 (×2): qty 15

## 2016-09-19 MED ORDER — MORPHINE SULFATE (PF) 2 MG/ML IV SOLN
2.0000 mg | INTRAVENOUS | Status: DC | PRN
Start: 2016-09-19 — End: 2016-09-23

## 2016-09-19 MED ORDER — FUROSEMIDE 10 MG/ML IJ SOLN
40.0000 mg | Freq: Once | INTRAMUSCULAR | Status: AC
  Administered 2016-09-19: 40 mg via INTRAVENOUS
  Filled 2016-09-19: qty 4

## 2016-09-19 MED ORDER — FLUTICASONE FUROATE-VILANTEROL 100-25 MCG/INH IN AEPB
1.0000 | INHALATION_SPRAY | Freq: Every day | RESPIRATORY_TRACT | Status: DC
  Administered 2016-09-20 – 2016-09-23 (×4): 1 via RESPIRATORY_TRACT
  Filled 2016-09-19: qty 28

## 2016-09-19 MED ORDER — SODIUM CHLORIDE 0.9 % IV SOLN: 30.0000 meq | Freq: Once | INTRAVENOUS | Status: DC

## 2016-09-19 MED ORDER — OXYCODONE HCL 5 MG PO TABS
5.0000 mg | ORAL_TABLET | ORAL | Status: DC | PRN
  Administered 2016-09-19 – 2016-09-22 (×4): 10 mg via ORAL
  Filled 2016-09-19 (×5): qty 2

## 2016-09-19 MED ORDER — TIOTROPIUM BROMIDE MONOHYDRATE 18 MCG IN CAPS
18.0000 ug | ORAL_CAPSULE | Freq: Every day | RESPIRATORY_TRACT | Status: DC
  Administered 2016-09-20 – 2016-09-23 (×4): 18 ug via RESPIRATORY_TRACT
  Filled 2016-09-19: qty 5

## 2016-09-19 MED ORDER — PANTOPRAZOLE SODIUM 40 MG PO TBEC
40.0000 mg | DELAYED_RELEASE_TABLET | Freq: Every day | ORAL | Status: DC
  Administered 2016-09-19 – 2016-09-22 (×4): 40 mg via ORAL
  Filled 2016-09-19 (×4): qty 1

## 2016-09-19 NOTE — Progress Notes (Signed)
2 Days Post-Op Procedure(s) (LRB): VIDEO ASSISTED THORACOSCOPY multiple/BLEBECTOMY, apical pleurectomy, mechanical pleural abrasion (Left) Subjective: Feels better today Pain control better Denies nausea, + flatus  Objective: Vital signs in last 24 hours: Temp:  [97.6 F (36.4 C)-97.8 F (36.6 C)] 97.7 F (36.5 C) (11/10 0738) Pulse Rate:  [78-109] 89 (11/10 0700) Cardiac Rhythm: Normal sinus rhythm (11/10 0732) Resp:  [11-26] 12 (11/10 0700) BP: (97-125)/(65-89) 112/75 (11/10 0700) SpO2:  [89 %-96 %] 91 % (11/10 0700) Arterial Line BP: (142)/(62) 142/62 (11/09 0800) Weight:  [190 lb 7.6 oz (86.4 kg)] 190 lb 7.6 oz (86.4 kg) (11/10 0500)  Hemodynamic parameters for last 24 hours:    Intake/Output from previous day: 11/09 0701 - 11/10 0700 In: 1684.8 [P.O.:1290; I.V.:59.8; IV Piggyback:315] Out: 1540 [Urine:1490; Chest Tube:50] Intake/Output this shift: No intake/output data recorded.  General appearance: alert, cooperative and no distress Neurologic: intact Heart: regular rate and rhythm Lungs: diminished breath sounds bilaterally and no wheezing Abdomen: mildly distended, tympanitic, nontender No air leak  Lab Results:  Recent Labs  09/18/16 0422 09/19/16 0400  WBC 17.2* 13.2*  HGB 13.0 12.7*  HCT 37.9* 36.9*  PLT 237 231   BMET:  Recent Labs  09/18/16 0422 09/19/16 0400  NA 134* 136  K 4.1 3.6  CL 101 101  CO2 27 28  GLUCOSE 151* 110*  BUN 12 14  CREATININE 0.83 0.85  CALCIUM 8.3* 8.2*    PT/INR:  Recent Labs  09/16/16 1703  LABPROT 14.0  INR 1.08   ABG    Component Value Date/Time   PHART 7.392 09/18/2016 0412   HCO3 26.4 09/18/2016 0412   TCO2 28 09/18/2016 0412   O2SAT 90.0 09/18/2016 0412   CBG (last 3)   Recent Labs  09/18/16 1632 09/18/16 2130 09/19/16 0734  GLUCAP 116* 117* 108*    Assessment/Plan: S/P Procedure(s) (LRB): VIDEO ASSISTED THORACOSCOPY multiple/BLEBECTOMY, apical pleurectomy, mechanical pleural abrasion  (Left)   -POD # 2 blebectomy No air leak- will place CT to water seal Down to 4 L Leon O2- wean as tolerated Leukocytosis- WBC trending down, follow Anemia secondary to ABL- mild, follow SCD + enoxaparin for DVT prophylaxis Increase ambulation Transfer to 3 south when bed available    LOS: 3 days    Loreli SlotSteven C Kaiyah Eber 09/19/2016

## 2016-09-19 NOTE — Progress Notes (Signed)
PCCM PROGRESS NOTE  Admission date: 09/16/2016 Referring provider: Outside hospital  CC: Short of breath  History: 60 yo male former smoker presented to outside hospital with sudden onset of dyspnea and chest pain.  Found to have left spontaneous PTX and had chest tube placed.  Had persistent pneumothorax, and [facility].  TCTS consulted.  CT chest showed severe bullous emphysema.  Had blebectomy, apical pleurectomy, and mechanical abrasion.  Subjective: Intermittent cough.  Chest pain better.  Breathing medicines help.  Vital signs: BP 138/87   Pulse 93   Temp 97.7 F (36.5 C) (Oral)   Resp (!) 21   Ht 6\' 1"  (1.854 m)   Wt 190 lb 7.6 oz (86.4 kg)   SpO2 92%   BMI 25.13 kg/m   Intake/output: I/O last 3 completed shifts: In: 3499.8 [P.O.:1530; I.V.:1584.8; Other:20; IV Piggyback:365] Out: 2275 [Urine:2090; Chest Tube:185]  General: alert, sitting in chair HEENT: no stridor Cardiac: regular, no murmur Chest: no wheeze, Lt chest tube in place >> no air leak Abd: soft, non tender Ext: no edema Skin: no rashes Neuro: normal strength   CMP Latest Ref Rng & Units 09/19/2016 09/18/2016 09/17/2016  Glucose 65 - 99 mg/dL 161(W110(H) 960(A151(H) 540(J103(H)  BUN 6 - 20 mg/dL 14 12 15   Creatinine 0.61 - 1.24 mg/dL 8.110.85 9.140.83 7.821.00  Sodium 135 - 145 mmol/L 136 134(L) 137  Potassium 3.5 - 5.1 mmol/L 3.6 4.1 4.5  Chloride 101 - 111 mmol/L 101 101 102  CO2 22 - 32 mmol/L 28 27 25   Calcium 8.9 - 10.3 mg/dL 8.2(L) 8.3(L) 8.8(L)  Total Protein 6.5 - 8.1 g/dL 5.9(L) - -  Total Bilirubin 0.3 - 1.2 mg/dL 0.8 - -  Alkaline Phos 38 - 126 U/L 42 - -  AST 15 - 41 U/L 30 - -  ALT 17 - 63 U/L 21 - -    CBC Latest Ref Rng & Units 09/19/2016 09/18/2016 09/17/2016  WBC 4.0 - 10.5 K/uL 13.2(H) 17.2(H) 14.8(H)  Hemoglobin 13.0 - 17.0 g/dL 12.7(L) 13.0 14.5  Hematocrit 39.0 - 52.0 % 36.9(L) 37.9(L) 42.7  Platelets 150 - 400 K/uL 231 237 241    CBG (last 3)   Recent Labs  09/18/16 1632  09/18/16 2130 09/19/16 0734  GLUCAP 116* 117* 108*     Imaging Dg Chest Port 1 View  Result Date: 09/19/2016 CLINICAL DATA:  Status post video-assisted thoracoscopy for left blebectomy and pleurectomy and pleural abrasion EXAM: PORTABLE CHEST 1 VIEW COMPARISON:  Portable chest x-ray of September 18, 2016 FINDINGS: The lungs are mildly hypoinflated. The interstitial markings of the left lung remain mildly increased. The chest tube tip projects over the posterior medial aspect of the left third rib and is stable. There is no pneumothorax nor significant pleural effusion. There is minimal right basilar atelectasis or fibrosis. The heart and pulmonary vascularity are normal. The observed bony thorax is unremarkable. IMPRESSION: Stable appearance of the chest since yesterday's study. No pneumothorax or significant pleural effusion. Electronically Signed   By: David  SwazilandJordan M.D.   On: 09/19/2016 07:46   Dg Chest Port 1 View  Result Date: 09/18/2016 CLINICAL DATA:  Status post VATS, chest pain EXAM: PORTABLE CHEST 1 VIEW COMPARISON:  09/17/2016 FINDINGS: Endotracheal tube is been removed in the interval. A left-sided thoracostomy catheter is again seen. Postsurgical changes are noted in the apex. No pneumothorax is identified. Persistent interstitial changes are noted within the left lung. Right basilar atelectasis is seen. Emphysematous changes are noted in  the right apex. IMPRESSION: No recurrent pneumothorax is identified. Stable increased interstitial changes on the left. Electronically Signed   By: Alcide CleverMark  Lukens M.D.   On: 09/18/2016 08:06   Dg Chest Port 1 View  Result Date: 09/17/2016 CLINICAL DATA:  ET tube placement status post lung surgery EXAM: PORTABLE CHEST 1 VIEW COMPARISON:  09/17/2016, chest CT 09/16/2016 FINDINGS: Endotracheal tube has been placed, the tip is approximately 4.5 cm superior to the carina. Repositioning of left chest tube with tip now positioned at the left lung apex. There is  postsurgical change not evident at the left lung apex. There is decreased left-sided pneumothorax. There is a small amount of soft tissue gas left lateral lower chest wall. There is improved aeration of the left base. Probable tiny left effusion. Bulla are again visualized in the right upper lobe. Streaky atelectasis at the right base, slightly improved. Diffuse interstitial opacities in the left thorax could relate to asymmetric edema or infiltrates. Stable cardiomediastinal silhouette. Slightly elevated left diaphragm. IMPRESSION: 1. Endotracheal tube tip is at the superior margin of the clavicles. Repositioning of left-sided chest tube as above. Postsurgical changes at the left lung apex with resolution of previously noted left apical pneumothorax. 2. Improved aeration of the left lung base. Suspect tiny left effusion. Increased left interstitial opacities could reflect asymmetric edema. 3. Subsegmental atelectasis at the right base, slightly improved. 4. Large bulla in the right upper lobe as before. Electronically Signed   By: Jasmine PangKim  Fujinaga M.D.   On: 09/17/2016 15:00    Studies: CT chest 11/06 >> severe emphysema with bulla and blebs  Events: 11/06 Admit to outside hospital  11/07 [facility], TCTS consulted 11/08 Lt VATS, blebectomy, apical pleurectomy, mechanical pleural abrasion  Assessment/plan:  Spontaneous Lt PTX. - post op care per TCTS  COPD with severe emphysema. - add spiriva, breo - prn albuterol - oxygen to keep SpO2 > 92% - bronchial hygiene - will need PFTs, A1AT measurement as outpt  Hyperglycemia. - SSI  Hx of GERD. - protonix  Updated family at bedside.  Coralyn HellingVineet Averianna Brugger, MD [facility] 09/19/2016, 11:25 AM Pager:  [phone] After 3pm call: [phone]

## 2016-09-19 NOTE — Discharge Summary (Signed)
Physician Discharge Summary  See Dr. Kavin LeechByrum's dc summary

## 2016-09-19 NOTE — Discharge Instructions (Signed)
Thoracoscopy, Care After °Refer to this sheet in the next few weeks. These instructions provide you with information about caring for yourself after your procedure. Your health care provider may also give you more specific instructions. Your treatment has been planned according to current medical practices, but problems sometimes occur. Call your health care provider if you have any problems or questions after your procedure. °WHAT TO EXPECT AFTER THE PROCEDURE: °After your procedure, it is common to feel sore for up to two weeks. °HOME CARE INSTRUCTIONS °· There are many different ways to close and cover an incision, including stitches (sutures), skin glue, and adhesive strips. Follow your health care provider's instructions about: °¨ Incision care. °¨ Bandage (dressing) changes and removal. °¨ Incision closure removal. °· Check your incision area every day for signs of infection. Watch for: °¨ Redness, swelling, or pain. °¨ Fluid, blood, or pus. °· Take medicines only as directed by your health care provider. °· Try to cough often. Coughing helps to protect against lung infection (pneumonia). It may hurt to cough. If this happens, hold a pillow against your chest when you cough. °· Take deep breaths. This also helps to protect against pneumonia. °· If you were given an incentive spirometer, use it as directed by your health care provider. °· Do not take baths, swim, or use a hot tub until your health care provider approves. You may take showers. °· Avoid lifting until your health care provider approves. °· Avoid driving until your health care provider approves. °· Do not travel by airplane after the chest tube is removed until your health care provider approves. °SEEK MEDICAL CARE IF: °· You have a fever. °· Pain medicines do not ease your pain. °· You have redness, swelling, or increasing pain in your incision area. °· You develop a cough that does not go away, or you are coughing up mucus that is yellow or  green. °SEEK IMMEDIATE MEDICAL CARE IF: °· You have fluid, blood, or pus coming from your incision. °· There is a bad smell coming from your incision or dressing. °· You develop a rash. °· You have difficulty breathing. °· You cough up blood. °· You develop light-headedness or you feel faint. °· You develop chest pain. °· Your heartbeat feels irregular or very fast. °  °This information is not intended to replace advice given to you by your health care provider. Make sure you discuss any questions you have with your health care provider. °  °Document Released: 05/16/2005 Document Revised: 11/17/2014 Document Reviewed: 07/12/2014 °Elsevier Interactive Patient Education ©2016 [employer] ° °

## 2016-09-20 ENCOUNTER — Inpatient Hospital Stay (HOSPITAL_COMMUNITY): Payer: 59

## 2016-09-20 LAB — BASIC METABOLIC PANEL
Anion gap: 8 (ref 5–15)
BUN: 14 mg/dL (ref 6–20)
CHLORIDE: 101 mmol/L (ref 101–111)
CO2: 27 mmol/L (ref 22–32)
CREATININE: 0.79 mg/dL (ref 0.61–1.24)
Calcium: 8.4 mg/dL — ABNORMAL LOW (ref 8.9–10.3)
GFR calc non Af Amer: >60 mL/min (ref >60)
GLUCOSE: 102 mg/dL — AB (ref 65–99)
Potassium: 3.4 mmol/L — ABNORMAL LOW (ref 3.5–5.1)
Sodium: 136 mmol/L (ref 135–145)

## 2016-09-20 LAB — GLUCOSE, CAPILLARY
GLUCOSE-CAPILLARY: 97 mg/dL (ref 65–99)
Glucose-Capillary: 121 mg/dL — ABNORMAL HIGH (ref 65–99)
Glucose-Capillary: 95 mg/dL (ref 65–99)
Glucose-Capillary: 108 mg/dL — ABNORMAL HIGH (ref 65–99)

## 2016-09-20 LAB — CBC
HEMATOCRIT: 36.1 % — AB (ref 39.0–52.0)
HEMOGLOBIN: 12.4 g/dL — AB (ref 13.0–17.0)
MCH: 30.6 pg (ref 26.0–34.0)
MCHC: 34.3 g/dL (ref 30.0–36.0)
MCV: 89.1 fL (ref 78.0–100.0)
Platelets: 258 10*3/uL (ref 150–400)
RBC: 4.05 MIL/uL — ABNORMAL LOW (ref 4.22–5.81)
RDW: 12.2 % (ref 11.5–15.5)
WBC: 11.8 10*3/uL — ABNORMAL HIGH (ref 4.0–10.5)

## 2016-09-20 MED ORDER — ALPRAZOLAM 0.25 MG PO TABS
0.2500 mg | ORAL_TABLET | Freq: Once | ORAL | Status: AC
  Administered 2016-09-20: 0.25 mg via ORAL
  Filled 2016-09-20: qty 1

## 2016-09-20 MED ORDER — POTASSIUM CHLORIDE CRYS ER 20 MEQ PO TBCR
40.0000 meq | EXTENDED_RELEASE_TABLET | Freq: Once | ORAL | Status: AC
  Administered 2016-09-20: 40 meq via ORAL
  Filled 2016-09-20: qty 2

## 2016-09-20 NOTE — Progress Notes (Addendum)
[address]       [place] [id number]             [phone]      3 Days Post-Op Procedure(s) (LRB): VIDEO ASSISTED THORACOSCOPY multiple/BLEBECTOMY, apical pleurectomy, mechanical pleural abrasion (Left) Subjective: Feeling pretty well  Objective: Vital signs in last 24 hours: Temp:  [97.7 F (36.5 C)-98.3 F (36.8 C)] 98.1 F (36.7 C) (11/11 0640) Pulse Rate:  [88-106] 106 (11/11 0830) Cardiac Rhythm: Sinus tachycardia (11/11 0830) Resp:  [15-24] 21 (11/11 0830) BP: (117-132)/(70-82) 131/81 (11/11 0640) SpO2:  [90 %-93 %] 92 % (11/11 0830)  Hemodynamic parameters for last 24 hours:    Intake/Output from previous day: 11/10 0701 - 11/11 0700 In: 480 [P.O.:480] Out: 340 [Urine:320; Chest Tube:20] Intake/Output this shift: No intake/output data recorded.  General appearance: alert, cooperative and no distress Heart: regular rate and rhythm Lungs: mildly dim in left base Abdomen: soft, non-tender Extremities: no edema Wound: incis healing well  Lab Results:  Recent Labs  09/19/16 0400 09/20/16 0254  WBC 13.2* 11.8*  HGB 12.7* 12.4*  HCT 36.9* 36.1*  PLT 231 258   BMET:  Recent Labs  09/19/16 0400 09/20/16 0254  NA 136 136  K 3.6 3.4*  CL 101 101  CO2 28 27  GLUCOSE 110* 102*  BUN 14 14  CREATININE 0.85 0.79  CALCIUM 8.2* 8.4*    PT/INR: No results for input(s): LABPROT, INR in the last 72 hours. ABG    Component Value Date/Time   PHART 7.392 09/18/2016 0412   HCO3 26.4 09/18/2016 0412   TCO2 28 09/18/2016 0412   O2SAT 90.0 09/18/2016 0412   CBG (last 3)   Recent Labs  09/19/16 1641 09/19/16 2112 09/20/16 0755  GLUCAP 112* 106* 121*    Meds Scheduled Meds: . acetaminophen  1,000 mg Oral Q6H   Or  . acetaminophen (TYLENOL) oral liquid 160 mg/5 mL  1,000 mg Oral Q6H  . enoxaparin (LOVENOX) injection  40 mg Subcutaneous Q24H  . fluticasone furoate-vilanterol  1 puff Inhalation Daily  . insulin aspart  0-15  Units Subcutaneous TID WC  . pantoprazole  40 mg Oral Q1200  . senna-docusate  1 tablet Oral QHS  . tiotropium  18 mcg Inhalation Daily   Continuous Infusions: . sodium chloride Stopped (09/18/16 2000)   PRN Meds:.albuterol, bisacodyl, morphine injection, oxyCODONE, potassium chloride (KCL MULTIRUN) 30 mEq in 265 mL IVPB  Xrays Dg Chest Port 1 View  Result Date: 09/20/2016 CLINICAL DATA:  History of spontaneous pneumothorax.  Chest tube. EXAM: PORTABLE CHEST 1 VIEW COMPARISON:  September 19, 2016 FINDINGS: The left chest tube remains in place, terminating in the left apex. No pneumothorax. Interstitial markings on the left are greater than on the right, similar in the interval given difference in technique. Mild atelectasis in the right base. No other interval changes. IMPRESSION: 1. Stable left chest tube with no pneumothorax. 2. Increased interstitial markings on the left, asymmetric to the right, similar to mildly worsened in the interval given difference in technique. Electronically Signed   By: Gerome Samavid  Williams III M.D   On: 09/20/2016 08:13   Dg Chest Port 1 View  Result Date: 09/19/2016 CLINICAL DATA:  Status post video-assisted thoracoscopy for left blebectomy and pleurectomy and pleural abrasion EXAM: PORTABLE CHEST 1 VIEW COMPARISON:  Portable chest x-ray of September 18, 2016 FINDINGS: The lungs are mildly hypoinflated. The interstitial markings of the left lung remain mildly increased. The  chest tube tip projects over the posterior medial aspect of the left third rib and is stable. There is no pneumothorax nor significant pleural effusion. There is minimal right basilar atelectasis or fibrosis. The heart and pulmonary vascularity are normal. The observed bony thorax is unremarkable. IMPRESSION: Stable appearance of the chest since yesterday's study. No pneumothorax or significant pleural effusion. Electronically Signed   By: David  SwazilandJordan M.D.   On: 09/19/2016 07:46     Assessment/Plan: S/P Procedure(s) (LRB): VIDEO ASSISTED THORACOSCOPY multiple/BLEBECTOMY, apical pleurectomy, mechanical pleural abrasion (Left)  1 chest tube- no air leak, only minor drainage 2 CXR does show some increased interstitial markings, no pntx 3 poss d/c tube today 4 medical management of pulm issues as per consult   LOS: 4 days    GOLD,WAYNE E 09/20/2016 Patient seen and examined, agree with above Dc chest tube  Viviann SpareSteven C. Dorris FetchHendrickson, MD [facility] [phone]

## 2016-09-20 NOTE — Progress Notes (Signed)
PCCM PROGRESS NOTE  Admission date: 09/16/2016 Referring provider: Outside hospital  CC: Short of breath  History: 60 y/o male former smoker presented to outside hospital with sudden onset of dyspnea and chest pain.  Found to have left spontaneous PTX and had chest tube placed.  Had persistent pneumothorax, [facility]  TCTS consulted.  CT chest showed severe bullous emphysema.  Had blebectomy, apical pleurectomy, and mechanical abrasion.  Subjective:  CT to water seal per TCTS on 11/10.  Pt denies acute complaints.  Some discomfort from chest tube when moving, cough with sputum production.   Vital signs: BP 131/81 (BP Location: Right Arm)   Pulse (!) 106   Temp 98.1 F (36.7 C) (Oral)   Resp (!) 21   Ht 6\' 1"  (1.854 m)   Wt 190 lb 7.6 oz (86.4 kg)   SpO2 92%   BMI 25.13 kg/m   Intake/output: I/O last 3 completed shifts: In: 905 [P.O.:570; Other:20; IV Piggyback:315] Out: 1335 [Urine:1285; Chest Tube:50]  General: alert, sitting in chair HEENT: no stridor Cardiac: regular, no murmur Chest: no wheeze, Lt chest tube in place >> no air leak, on water seal Abd: soft, non tender Ext: no edema Skin: no rashes Neuro: normal strength   CMP Latest Ref Rng & Units 09/20/2016 09/19/2016 09/18/2016  Glucose 65 - 99 mg/dL 161(W102(H) 960(A110(H) 540(J151(H)  BUN 6 - 20 mg/dL 14 14 12   Creatinine 0.61 - 1.24 mg/dL 8.110.79 9.140.85 7.820.83  Sodium 135 - 145 mmol/L 136 136 134(L)  Potassium 3.5 - 5.1 mmol/L 3.4(L) 3.6 4.1  Chloride 101 - 111 mmol/L 101 101 101  CO2 22 - 32 mmol/L 27 28 27   Calcium 8.9 - 10.3 mg/dL 9.5(A8.4(L) 2.1(H8.2(L) 8.3(L)  Total Protein 6.5 - 8.1 g/dL - 5.9(L) -  Total Bilirubin 0.3 - 1.2 mg/dL - 0.8 -  Alkaline Phos 38 - 126 U/L - 42 -  AST 15 - 41 U/L - 30 -  ALT 17 - 63 U/L - 21 -    CBC Latest Ref Rng & Units 09/20/2016 09/19/2016 09/18/2016  WBC 4.0 - 10.5 K/uL 11.8(H) 13.2(H) 17.2(H)  Hemoglobin 13.0 - 17.0 g/dL 12.4(L) 12.7(L) 13.0  Hematocrit 39.0 - 52.0 % 36.1(L)  36.9(L) 37.9(L)  Platelets 150 - 400 K/uL 258 231 237    CBG (last 3)   Recent Labs  09/19/16 1641 09/19/16 2112 09/20/16 0755  GLUCAP 112* 106* 121*     Imaging Dg Chest Port 1 View  Result Date: 09/20/2016 CLINICAL DATA:  History of spontaneous pneumothorax.  Chest tube. EXAM: PORTABLE CHEST 1 VIEW COMPARISON:  September 19, 2016 FINDINGS: The left chest tube remains in place, terminating in the left apex. No pneumothorax. Interstitial markings on the left are greater than on the right, similar in the interval given difference in technique. Mild atelectasis in the right base. No other interval changes. IMPRESSION: 1. Stable left chest tube with no pneumothorax. 2. Increased interstitial markings on the left, asymmetric to the right, similar to mildly worsened in the interval given difference in technique. Electronically Signed   By: Gerome Samavid  Williams III M.D   On: 09/20/2016 08:13   Dg Chest Port 1 View  Result Date: 09/19/2016 CLINICAL DATA:  Status post video-assisted thoracoscopy for left blebectomy and pleurectomy and pleural abrasion EXAM: PORTABLE CHEST 1 VIEW COMPARISON:  Portable chest x-ray of September 18, 2016 FINDINGS: The lungs are mildly hypoinflated. The interstitial markings of the left lung remain mildly increased. The chest tube tip  projects over the posterior medial aspect of the left third rib and is stable. There is no pneumothorax nor significant pleural effusion. There is minimal right basilar atelectasis or fibrosis. The heart and pulmonary vascularity are normal. The observed bony thorax is unremarkable. IMPRESSION: Stable appearance of the chest since yesterday's study. No pneumothorax or significant pleural effusion. Electronically Signed   By: David  [place]azil[place]Jordan M.D.   On: 09/19/2016 07:46    Studies: CT chest 11/06 >> severe emphysema with bulla and blebs  Events: 11/06 Admit to outside hospital  [facility], TCTS consulted 11/08 Lt VATS, blebectomy,  apical pleurectomy, mechanical pleural abrasion 11/10 CT to water seal   Assessment/plan:  Spontaneous Lt PTX. - post op care per TCTS  COPD with severe emphysema. - continue spiriva, breo - prn albuterol - oxygen to keep SpO2 > 92% - bronchial hygiene - will need PFTs, A1AT measurement as outpt - will need pulmonary follow up arranged at discharge  - cxr images personally reviewed  Hypokalemia  - replace electrolytes as indicated  - trend BMP / UOP   Hyperglycemia. - SSI  Hx of GERD. - protonix  PCCM will see again 11/13.  Please call sooner if new needs arise.    Canary BrimBrandi Koty Anctil, NP-C [facility] Pgr: [phone] or if no answer [phone] 09/20/2016, 9:40 AM

## 2016-09-21 ENCOUNTER — Inpatient Hospital Stay (HOSPITAL_COMMUNITY): Payer: 59

## 2016-09-21 ENCOUNTER — Encounter (HOSPITAL_COMMUNITY): Payer: Self-pay | Admitting: Surgical

## 2016-09-21 LAB — GLUCOSE, CAPILLARY
GLUCOSE-CAPILLARY: 101 mg/dL — AB (ref 65–99)
GLUCOSE-CAPILLARY: 144 mg/dL — AB (ref 65–99)
GLUCOSE-CAPILLARY: 101 mg/dL — AB (ref 65–99)
Glucose-Capillary: 136 mg/dL — ABNORMAL HIGH (ref 65–99)

## 2016-09-21 MED ORDER — ALPRAZOLAM 0.25 MG PO TABS
0.2500 mg | ORAL_TABLET | Freq: Once | ORAL | Status: AC
  Administered 2016-09-21: 0.25 mg via ORAL
  Filled 2016-09-21: qty 1

## 2016-09-21 NOTE — Progress Notes (Addendum)
[address]       [place] [id number]             [phone]      4 Days Post-Op Procedure(s) (LRB): VIDEO ASSISTED THORACOSCOPY multiple/BLEBECTOMY, apical pleurectomy, mechanical pleural abrasion (Left) Subjective: Feels better with tube out, currently on 6 liters [place]  Objective: Vital signs in last 24 hours: Temp:  [97.8 F (36.6 C)-98.6 F (37 C)] 98.3 F (36.8 C) (11/12 0745) Pulse Rate:  [91-107] 91 (11/12 0745) Cardiac Rhythm: Normal sinus rhythm (11/12 0849) Resp:  [18-23] 19 (11/12 0745) BP: (115-131)/(67-78) 123/67 (11/12 0745) SpO2:  [92 %-95 %] 92 % (11/12 0745)  Hemodynamic parameters for last 24 hours:    Intake/Output from previous day: 11/11 0701 - 11/12 0700 In: -  Out: 500 [Urine:500] Intake/Output this shift: No intake/output data recorded.  General appearance: alert, cooperative and no distress Heart: regular rate and rhythm Lungs: fair air exchange, no wheeze or crackles Abdomen: benign Extremities: no edema Wound: incis healing well  Lab Results:  Recent Labs  09/19/16 0400 09/20/16 0254  WBC 13.2* 11.8*  HGB 12.7* 12.4*  HCT 36.9* 36.1*  PLT 231 258   BMET:   Recent Labs  09/19/16 0400 09/20/16 0254  NA 136 136  K 3.6 3.4*  CL 101 101  CO2 28 27  GLUCOSE 110* 102*  BUN 14 14  CREATININE 0.85 0.79  CALCIUM 8.2* 8.4*    PT/INR: No results for input(s): LABPROT, INR in the last 72 hours. ABG    Component Value Date/Time   PHART 7.392 09/18/2016 0412   HCO3 26.4 09/18/2016 0412   TCO2 28 09/18/2016 0412   O2SAT 90.0 09/18/2016 0412   CBG (last 3)   Recent Labs  09/20/16 1724 09/20/16 2121 09/21/16 0745  GLUCAP 97 108* 101*    Meds Scheduled Meds: . acetaminophen  1,000 mg Oral Q6H   Or  . acetaminophen (TYLENOL) oral liquid 160 mg/5 mL  1,000 mg Oral Q6H  . enoxaparin (LOVENOX) injection  40 mg Subcutaneous Q24H  . fluticasone furoate-vilanterol  1 puff Inhalation Daily  . insulin  aspart  0-15 Units Subcutaneous TID WC  . pantoprazole  40 mg Oral Q1200  . senna-docusate  1 tablet Oral QHS  . tiotropium  18 mcg Inhalation Daily   Continuous Infusions: . sodium chloride Stopped (09/18/16 2000)   PRN Meds:.albuterol, bisacodyl, morphine injection, oxyCODONE, potassium chloride (KCL MULTIRUN) 30 mEq in 265 mL IVPB  Xrays Dg Chest 1v Repeat Same Day  Result Date: 09/20/2016 CLINICAL DATA:  Patient status post left-sided chest tube removal. EXAM: CHEST - 1 VIEW SAME DAY COMPARISON:  Chest radiograph 09/19/2016. FINDINGS: Monitoring leads overlie the patient. Interval removal left-sided chest tube. Low lung volumes. Stable cardiac and mediastinal contours. Grossly unchanged heterogeneous opacities throughout the left lung. Surgical changes left upper hemi thorax. No definite pleural effusion or pneumothorax. Emphysematous change right lung. IMPRESSION: Interval removal left-sided chest tube.  No pneumothorax. Grossly unchanged heterogeneous opacities throughout the left lung with more focal left lower lung consolidation. Electronically Signed   By: Annia Beltrew  Davis M.D.   On: 09/20/2016 13:42   Dg Chest Port 1 View  Result Date: 09/21/2016 CLINICAL DATA:  Chest tube removal.  Evaluate for pneumothorax. EXAM: PORTABLE CHEST 1 VIEW COMPARISON:  September 20, 2016 FINDINGS: Postsurgical changes identified on the left. The left chest tube has been removed with no pneumothorax. Increased interstitial markings on the left versus  the right with more focal opacity in the left base, unchanged in the interval. Right basilar atelectasis. A paucity of lung markings in the right upper lung is stable with no evidence of pneumothorax, likely due to emphysematous changes. IMPRESSION: No pneumothorax after chest tube removal. Stable increased interstitial markings on the left versus the right with mildly more focal opacity in the left base. Recommend attention on follow-up. Electronically Signed   By:  Gerome Samavid  Williams III M.D   On: 09/21/2016 07:26   Dg Chest Port 1 View  Result Date: 09/20/2016 CLINICAL DATA:  History of spontaneous pneumothorax.  Chest tube. EXAM: PORTABLE CHEST 1 VIEW COMPARISON:  September 19, 2016 FINDINGS: The left chest tube remains in place, terminating in the left apex. No pneumothorax. Interstitial markings on the left are greater than on the right, similar in the interval given difference in technique. Mild atelectasis in the right base. No other interval changes. IMPRESSION: 1. Stable left chest tube with no pneumothorax. 2. Increased interstitial markings on the left, asymmetric to the right, similar to mildly worsened in the interval given difference in technique. Electronically Signed   By: Gerome Samavid  Williams III M.D   On: 09/20/2016 08:13    Assessment/Plan: S/P Procedure(s) (LRB): VIDEO ASSISTED THORACOSCOPY multiple/BLEBECTOMY, apical pleurectomy, mechanical pleural abrasion (Left)  1 CXR shows no pntx, opacities look pretty stable 2 still requiring increase FiO2- on 3 liters yesterday was in 80's for saturation- wean as able , will probably require home O2 at least short term 3 cont aggressive pulmonary medical management as per consult     LOS: 5 days    GOLD,WAYNE E 09/21/2016 Patient seen and examined, agree with above Wound looks good Still on O2  Ceejay Kegley C. Dorris FetchHendrickson, MD [facility] [phone]

## 2016-09-21 NOTE — Progress Notes (Signed)
eLink Physician-Brief Progress Note Patient Name: Jay LardJoseph T Mitchell DOB: 03/10/1956 MRN: [id number]   Date of Service  09/21/2016  HPI/Events of Note  RN CALLS re: pt wanting xanax again. Pt with anxiety, discomfort, unable to sleep.  Had xanax 0.25 mg PO x 1 last night and helped him.   eICU Interventions  Will order xanax x1. Day team to assess need for xanax in the morning.      Intervention Category Minor Interventions: Agitation / anxiety - evaluation and management  Daneen SchickJose Angelo A De Dios 09/21/2016, 11:21 PM

## 2016-09-22 DIAGNOSIS — J439 Emphysema, unspecified: Principal | ICD-10-CM

## 2016-09-22 LAB — GLUCOSE, CAPILLARY
GLUCOSE-CAPILLARY: 104 mg/dL — AB (ref 65–99)
Glucose-Capillary: 116 mg/dL — ABNORMAL HIGH (ref 65–99)
Glucose-Capillary: 114 mg/dL — ABNORMAL HIGH (ref 65–99)
Glucose-Capillary: 107 mg/dL — ABNORMAL HIGH (ref 65–99)

## 2016-09-22 MED ORDER — ALPRAZOLAM 0.25 MG PO TABS
0.2500 mg | ORAL_TABLET | Freq: Once | ORAL | Status: AC
  Administered 2016-09-22: 0.25 mg via ORAL
  Filled 2016-09-22: qty 1

## 2016-09-22 NOTE — Progress Notes (Signed)
SATURATION QUALIFICATIONS: (This note is used to comply with regulatory documentation for home oxygen)  Patient Saturations on Room Air at Rest = 91%  Patient Saturations on Room Air while Ambulating = 78%  Patient Saturations on 2 Liters of oxygen while Ambulating = 87%  Please briefly explain why patient needs home oxygen:  Patient has increased shortness of breath, work of breathing, and decreased oxygen saturation on exertion and while talking. On 2 liters of oxygen patient's oxygen saturation dropped to 87% while ambulating. Increased patient to 3 liters of oxygen and saturation increased to and maintained at 90% while ambulating.    Curtis SitesHayley H Avion Kutzer, RN

## 2016-09-22 NOTE — Progress Notes (Addendum)
      [address]       [place] [id number]             [phone]      5 Days Post-Op Procedure(s) (LRB): VIDEO ASSISTED THORACOSCOPY multiple/BLEBECTOMY, apical pleurectomy, mechanical pleural abrasion (Left)   Subjective:  No new complaints.  States he feels cough is clearing up some.  Has been able to wean down his oxygen.  Objective: Vital signs in last 24 hours: Temp:  [97.9 F (36.6 C)-98.6 F (37 C)] 98.4 F (36.9 C) (11/13 0403) Pulse Rate:  [85-105] 85 (11/13 0403) Cardiac Rhythm: Normal sinus rhythm (11/12 1901) Resp:  [17-27] 21 (11/13 0403) BP: (125-138)/(65-81) 138/69 (11/13 0403) SpO2:  [88 %-97 %] 89 % (11/13 0600)  General appearance: alert, cooperative and no distress Heart: regular rate and rhythm Lungs: dminished bilaterally Abdomen: soft, non-tender; bowel sounds normal; no masses,  no organomegaly Wound: clean and dry  Lab Results:  Recent Labs  09/20/16 0254  WBC 11.8*  HGB 12.4*  HCT 36.1*  PLT 258   BMET:  Recent Labs  09/20/16 0254  NA 136  K 3.4*  CL 101  CO2 27  GLUCOSE 102*  BUN 14  CREATININE 0.79  CALCIUM 8.4*    PT/INR: No results for input(s): LABPROT, INR in the last 72 hours. ABG    Component Value Date/Time   PHART 7.392 09/18/2016 0412   HCO3 26.4 09/18/2016 0412   TCO2 28 09/18/2016 0412   O2SAT 90.0 09/18/2016 0412   CBG (last 3)   Recent Labs  09/21/16 1118 09/21/16 1647 09/21/16 2105  GLUCAP 144* 136* 101*    Assessment/Plan: S/P Procedure(s) (LRB): VIDEO ASSISTED THORACOSCOPY multiple/BLEBECTOMY, apical pleurectomy, mechanical pleural abrasion (Left)  1. Pulm- emphysematous COPD- has been weaned down to 3L via [place].. sats around 94%, continue Pulm recs, use of IS... Will need oxygen at home will place orders 2. Dispo- patient stable, oxygen requirement decreased, will arrange home oxygen, continue current care   LOS: 6 days    BARRETT, ERIN 09/22/2016 His tubes are out and  he has no surgical issues. Plan per Pulmonary  Viviann SpareSteven C. Dorris FetchHendrickson, MD [facility] [phone]

## 2016-09-22 NOTE — Progress Notes (Signed)
eLink Physician-Brief Progress Note Patient Name: Jay LardJoseph T Mitchell DOB: 05/18/1956 MRN: [id number]   Date of Service  09/22/2016  HPI/Events of Note  Anxiety.  eICU Interventions  Will order Xanax 0.25 mg PO X 1 now.      Intervention Category Minor Interventions: Agitation / anxiety - evaluation and management  Lenell AntuSommer,Steven Eugene 09/22/2016, 8:48 PM

## 2016-09-22 NOTE — Care Management Note (Addendum)
Case Management Note  Patient Details  Name: Marlene LardJoseph T Zahniser MRN: [id number] Date of Birth: 03/27/1956  Subjective/Objective:   Patient lives with spouse, he was indep pta, he is s/p VATS blebectomy, he will need home oxygen when he dc home tomorrow and [facility], [facility] , [facility] [place] [facility]  Soc will begin 24-48 hrs post dc.   11/14- Letha Capeeborah Dravyn Severs RN, BSN - patient is for dc today, [facility] ,he has oyxgen tank in room and they will be going to his home to set up concentrator.  He will also have HHRN for pna and sats.                    Action/Plan:   Expected Discharge Date:                  Expected Discharge Plan:  Home w Home Health Services  In-House Referral:     Discharge planning Services  CM Consult  Post Acute Care Choice:  Home Health, Durable Medical Equipment Choice offered to:  Patient  DME Arranged:  Oxygen DME Agency:  [facility]  HH [facility]  [facility]  [facility]  Status of Service:  In process, will continue to follow  If discussed at Long Length of Stay Meetings, dates discussed:    Additional Comments:  Leone Havenaylor, Faye Strohman [place], RN 09/22/2016, 4:04 PM

## 2016-09-22 NOTE — Progress Notes (Signed)
PCCM PROGRESS NOTE  Admission date: 09/16/2016 Referring provider: Outside hospital  CC: Short of breath  History: 60 y/o male former smoker presented to outside hospital with sudden onset of dyspnea and chest pain.  Found to have left spontaneous PTX and had chest tube placed.  Had persistent pneumothorax, and [facility].  TCTS consulted.  CT chest showed severe bullous emphysema.  Had blebectomy, apical pleurectomy, and mechanical abrasion.  Subjective:  Has had some anxiety.  Overall improving.  Weaning O2.  CT out 11/12.  Wants to go home.   Vital signs: BP 128/71 (BP Location: Left Arm)   Pulse 88   Temp 97.7 F (36.5 C) (Oral)   Resp 19   Ht 6\' 1"  (1.854 m)   Wt 86.4 kg (190 lb 7.6 oz)   SpO2 92%   BMI 25.13 kg/m   Intake/output: I/O last 3 completed shifts: In: -  Out: 500 [Urine:500]  General: alert, NAD  HEENT: mm moist, no JVD Cardiac: regular, no murmur Chest: resps even non labored on 3L [place], diminished throughout, no audible wheeze Abd: soft, non tender Ext: no edema Skin: no rashes Neuro: normal strength   CMP Latest Ref Rng & Units 09/20/2016 09/19/2016 09/18/2016  Glucose 65 - 99 mg/dL 161(W102(H) 960(A110(H) 540(J151(H)  BUN 6 - 20 mg/dL 14 14 12   Creatinine 0.61 - 1.24 mg/dL 8.110.79 9.140.85 7.820.83  Sodium 135 - 145 mmol/L 136 136 134(L)  Potassium 3.5 - 5.1 mmol/L 3.4(L) 3.6 4.1  Chloride 101 - 111 mmol/L 101 101 101  CO2 22 - 32 mmol/L 27 28 27   Calcium 8.9 - 10.3 mg/dL 9.5(A8.4(L) 2.1(H8.2(L) 8.3(L)  Total Protein 6.5 - 8.1 g/dL - 5.9(L) -  Total Bilirubin 0.3 - 1.2 mg/dL - 0.8 -  Alkaline Phos 38 - 126 U/L - 42 -  AST 15 - 41 U/L - 30 -  ALT 17 - 63 U/L - 21 -    CBC Latest Ref Rng & Units 09/20/2016 09/19/2016 09/18/2016  WBC 4.0 - 10.5 K/uL 11.8(H) 13.2(H) 17.2(H)  Hemoglobin 13.0 - 17.0 g/dL 12.4(L) 12.7(L) 13.0  Hematocrit 39.0 - 52.0 % 36.1(L) 36.9(L) 37.9(L)  Platelets 150 - 400 K/uL 258 231 237    CBG (last 3)   Recent Labs  09/21/16 1647  09/21/16 2105 09/22/16 0835  GLUCAP 136* 101* 116*     Imaging Dg Chest 1v Repeat Same Day  Result Date: 09/20/2016 CLINICAL DATA:  Patient status post left-sided chest tube removal. EXAM: CHEST - 1 VIEW SAME DAY COMPARISON:  Chest radiograph 09/19/2016. FINDINGS: Monitoring leads overlie the patient. Interval removal left-sided chest tube. Low lung volumes. Stable cardiac and mediastinal contours. Grossly unchanged heterogeneous opacities throughout the left lung. Surgical changes left upper hemi thorax. No definite pleural effusion or pneumothorax. Emphysematous change right lung. IMPRESSION: Interval removal left-sided chest tube.  No pneumothorax. Grossly unchanged heterogeneous opacities throughout the left lung with more focal left lower lung consolidation. Electronically Signed   By: Annia Beltrew  Davis M.D.   On: 09/20/2016 13:42   Dg Chest Port 1 View  Result Date: 09/21/2016 CLINICAL DATA:  Chest tube removal.  Evaluate for pneumothorax. EXAM: PORTABLE CHEST 1 VIEW COMPARISON:  September 20, 2016 FINDINGS: Postsurgical changes identified on the left. The left chest tube has been removed with no pneumothorax. Increased interstitial markings on the left versus the right with more focal opacity in the left base, unchanged in the interval. Right basilar atelectasis. A paucity of lung markings in the right  upper lung is stable with no evidence of pneumothorax, likely due to emphysematous changes. IMPRESSION: No pneumothorax after chest tube removal. Stable increased interstitial markings on the left versus the right with mildly more focal opacity in the left base. Recommend attention on follow-up. Electronically Signed   By: Gerome Samavid  Williams III M.D   On: 09/21/2016 07:26    Studies: CT chest 11/06 >> severe emphysema with bulla and blebs  Events: 11/06 Admit to outside hospital  11/07 [facility], TCTS consulted 11/08 Lt VATS, blebectomy, apical pleurectomy, mechanical pleural  abrasion 11/10 CT to water seal  11/12 CT removed     Assessment/plan:  Spontaneous Lt PTX. - post op care per TCTS  COPD with severe emphysema. - continue spiriva, breo - prn albuterol - oxygen to keep SpO2 > 92% - bronchial hygiene - will need PFTs, A1AT measurement as outpt - outpt pulmonary f/u arranged 11/27 with Dr. Alvia GroveMannem  - likely need home O2 - check ambulatory desat   Hypokalemia  - replace electrolytes as indicated  - trend BMP / UOP   Hyperglycemia. - SSI  Hx of GERD. - protonix   Dirk DressKaty Whiteheart, NP 09/22/2016  9:48 AM Pager: [phone] or [phone]    Attending Note:  I have examined patient, reviewed labs, studies and notes. I have discussed the case with Jasper RilingK Whiteheart, and I agree with the data and plans as amended above.  Severe bullous COPD, experienced spontaneous PTX and required Blebectomy via VATS. Will plan for d/c to home on O2, his home BD's, likely 11/14.   Levy Pupaobert Deirdra Heumann, MD, PhD 09/22/2016, 3:51 PM [facility] [phone] or if no answer [phone]

## 2016-09-23 ENCOUNTER — Inpatient Hospital Stay (HOSPITAL_COMMUNITY): Payer: 59

## 2016-09-23 LAB — GLUCOSE, CAPILLARY: Glucose-Capillary: 149 mg/dL — ABNORMAL HIGH (ref 65–99)

## 2016-09-23 MED ORDER — FLUTICASONE FUROATE-VILANTEROL 100-25 MCG/INH IN AEPB: 1.0000 | INHALATION_SPRAY | Freq: Every day | RESPIRATORY_TRACT | 1 refills | Status: DC

## 2016-09-23 MED ORDER — TIOTROPIUM BROMIDE MONOHYDRATE 18 MCG IN CAPS: 18.0000 ug | ORAL_CAPSULE | Freq: Every day | RESPIRATORY_TRACT | 12 refills | Status: DC

## 2016-09-23 MED ORDER — OXYCODONE HCL 5 MG PO TABS: 5.0000 mg | ORAL_TABLET | ORAL | 0 refills | Status: DC | PRN

## 2016-09-23 MED ORDER — ALBUTEROL SULFATE HFA 108 (90 BASE) MCG/ACT IN AERS: 2.0000 | INHALATION_SPRAY | Freq: Four times a day (QID) | RESPIRATORY_TRACT | 2 refills | Status: DC | PRN

## 2016-09-23 NOTE — Progress Notes (Addendum)
      [address]       [place] [id number]             [phone]      6 Days Post-Op Procedure(s) (LRB): VIDEO ASSISTED THORACOSCOPY multiple/BLEBECTOMY, apical pleurectomy, mechanical pleural abrasion (Left)   Subjective:  [name] has no complaints this morning.  Hoping to go home.  Objective: Vital signs in last 24 hours: Temp:  [97.6 F (36.4 C)-99 F (37.2 C)] 97.8 F (36.6 C) (11/14 0745) Pulse Rate:  [88-103] 90 (11/14 0745) Cardiac Rhythm: Normal sinus rhythm (11/14 0745) Resp:  [13-26] 19 (11/14 0745) BP: (122-138)/(64-74) 123/71 (11/14 0745) SpO2:  [88 %-95 %] 92 % (11/14 0745)  Intake/Output from previous day: 11/13 0701 - 11/14 0700 In: 240 [P.O.:240] Out: -  Intake/Output this shift: Total I/O In: 240 [P.O.:240] Out: -   General appearance: alert, cooperative and no distress Heart: regular rate and rhythm Lungs: diminished breath sounds bilaterally Wound: clean and dry  Lab Results: No results for input(s): WBC, HGB, HCT, PLT in the last 72 hours. BMET: No results for input(s): NA, K, CL, CO2, GLUCOSE, BUN, CREATININE, CALCIUM in the last 72 hours.  PT/INR: No results for input(s): LABPROT, INR in the last 72 hours. ABG    Component Value Date/Time   PHART 7.392 09/18/2016 0412   HCO3 26.4 09/18/2016 0412   TCO2 28 09/18/2016 0412   O2SAT 90.0 09/18/2016 0412   CBG (last 3)   Recent Labs  09/22/16 1710 09/22/16 2114 09/23/16 0819  GLUCAP 114* 107* 149*    Assessment/Plan: S/P Procedure(s) (LRB): VIDEO ASSISTED THORACOSCOPY multiple/BLEBECTOMY, apical pleurectomy, mechanical pleural abrasion (Left)  1. Stable from surgical standpoint... Discharge instructions given, F/U appointment is in chart   LOS: 7 days    BARRETT, ERIN 09/23/2016 Patient seen and examined. Ok for [employer] home on home O2 from our standpoint  Viviann SpareSteven C. Dorris FetchHendrickson, MD [facility] [phone]

## 2016-09-23 NOTE — Progress Notes (Signed)
SATURATION QUALIFICATIONS: (This note is used to comply with regulatory documentation for home oxygen)  Patient Saturations on Room Air at Rest = 87  Patient Saturations on Room Air while Ambulating = 75  Patient Saturations on 3 Liters of oxygen while Ambulating = 86  Please briefly explain why patient needs home oxygen: pt on RA while ambulating is visibly sob with saturations as above,

## 2016-09-23 NOTE — Progress Notes (Signed)
Pt went for 2 view chest x-ray before discharge. Reviewed discharge instructions including signs and symptoms of infection, medications and follow up appts with pt and his spouse. Pt discharged on 3 liters nasal cannula O2. Oxygen from [facility] delivered to pt's room before discharge. [name] RN

## 2016-09-23 NOTE — Discharge Summary (Signed)
Physician Discharge Summary       Patient ID: Jay Mitchell MRN: [id number] DOB/AGE: 61/11/1955 61 y.o.  Admit date: 09/16/2016 Discharge date: 09/23/2016  Discharge Diagnoses:  Principal Problem:   Spontaneous pneumothorax Active Problems:   GERD (gastroesophageal reflux disease)   COPD (chronic obstructive pulmonary disease) (HCC)   Lung blebs (HCC)   Spontaneous tension pneumothorax   S/P lung surgery, follow-up exam   History of Present Illness: Patient is a 60 year old male with reported history of COPD, remote history of tobacco use, and GE reflux disease who was in his usual state of health until early yesterday morning when he developed sudden onset of left-sided chest pain and shortness of breath. At the time [place] where he was attending a conference. He was evaluated in the emergency department there and found to have a large left pneumothorax with complete collapse of the left lung. This was due to ruptured bleb. A small bore chest tube was placed but the lung only partially reexpanded. Subsequently the initial chest tube was removed and the larger chest tube placed by a surgeon. The patient was noted to have persistent air leak, and at the patient's request he was [facility]. Cardiothoracic surgical consultation was requested upon arrival. He is a former smoker who quit in 2004.  Hospital Course:  [facility]7 where he was accepted to the pulmonary service and treated for COPD with bronchodilators. Cardiothoracic surgery was consulted for evaluation of left sided pneumothorax and subsequently underwent VATS with multiple blebectomy, apical pleurectomy, and mechanical pleural abrasion on 11/8. Chest tube was removed 11/12. He was still requiring increased O2 (3L), however, it was unclear how acute this was given the radiographic  degree of bullae on admission. He states that just prior to admission he was [place] in the mountains and his family couldn't keep up with him, so he thinks that this hypoxia is not chronic. Despite increased O2 demands 11/14 he is deemed a candidate for discharge with plans to go home on O2.   Discharge Plan by active problems   Spontaneous Left Pneumothorax secondary to ruptured bleb in setting of bullous emphysema - s/p VATS, blebectomy, apical pleurectomy, mechanical pleural abrasion - CVTS follow up as scheduled   Severe bullous emphysema  - Will discharge on home O2 3L via [place] - Pulmonary follow up with PFT, A1AT measurement - Planning to see Dr. Isaiah SergeMannam 11/27 - Will discharge on Breo and Spiriva, which are working well as inpatient. PRN albuterol.  GERD - Resume home nexium    Significant Hospital tests/ studies  Consults   Discharge Exam: BP 123/71 (BP Location: Left Arm)   Pulse 90   Temp 97.8 F (36.6 C) (Oral)   Resp 19   Ht 6\' 1"  (1.854 m)   Wt 86.4 kg (190 lb 7.6 oz)   SpO2 92%   BMI 25.13 kg/m   General: alert, NAD  HEENT: mm moist, no JVD Cardiac: regular, no murmur Chest: resps even non labored on 3L [place], diminished throughout, no audible wheeze Abd: soft, non tender Ext: no edema Skin: no rashes Neuro: normal strength   Labs at discharge Lab Results  Component Value Date   CREATININE 0.79 09/20/2016   BUN 14 09/20/2016   NA 136 09/20/2016   K 3.4 (L) 09/20/2016   CL 101 09/20/2016   CO2 27 09/20/2016  Lab Results  Component Value Date   WBC 11.8 (H) 09/20/2016   HGB 12.4 (L) 09/20/2016   HCT 36.1 (L) 09/20/2016   MCV 89.1 09/20/2016   PLT 258 09/20/2016   Lab Results  Component Value Date   ALT 21 09/19/2016   AST 30 09/19/2016   ALKPHOS 42 09/19/2016   BILITOT 0.8 09/19/2016   Lab Results  Component Value Date   INR 1.08 09/16/2016    Current radiology studies No results found.  Disposition:  Final discharge disposition not  confirmed     Medication List    TAKE these medications   albuterol 108 (90 Base) MCG/ACT inhaler Commonly known as:  PROVENTIL HFA;VENTOLIN HFA Inhale 2 puffs into the lungs every 6 (six) hours as needed for wheezing or shortness of breath.   cetirizine 10 MG tablet Commonly known as:  ZYRTEC Take 10 mg by mouth daily.   esomeprazole 20 MG capsule Commonly known as:  NEXIUM Take 20 mg by mouth daily at 12 noon.   fluticasone furoate-vilanterol 100-25 MCG/INH Aepb Commonly known as:  BREO ELLIPTA Inhale 1 puff into the lungs daily.   MULTIVITAMIN PO Take 1 tablet by mouth daily.   naproxen sodium 220 MG tablet Commonly known as:  ANAPROX Take 220 mg by mouth at bedtime.   tiotropium 18 MCG inhalation capsule Commonly known as:  SPIRIVA Place 1 capsule (18 mcg total) into inhaler and inhale daily. Start taking on:  09/24/2016            Durable Medical Equipment        Start     Ordered   09/22/16 0815  For home use only DME oxygen  Once    Question Answer Comment  Mode or (Route) Nasal cannula   Liters per Minute 3   Frequency Continuous (stationary and portable oxygen unit needed)   Oxygen conserving device No   Oxygen delivery system Gas      09/22/16 0814     Follow-up Information    Loreli Slot, MD Follow up on 10/07/2016.   Specialty:  Cardiothoracic Surgery Why:  PA/LAT CXR to be taken (at [facility] which is in the same building as Dr. Sunday Corn office)on 10/07/2016 at 1:15 pm ;Appointment time is at 1:45 pm  Contact information: [address] [place] [address] [phone]        Chilton Greathouse, MD Follow up on 10/06/2016.   Specialty:  Pulmonary Disease Why:  9:30am  Contact information: [address] [place] [address] [phone]        [facility] Follow up.   Why:  home oxygen Contact information: [address] [place]  [address] [phone]        [facility] Follow up.   Why:  [facility] Contact information: [address] [place] [address] [phone]           Discharged Condition: good  Greater than 35 minutes of time have been dedicated to discharge assessment, planning and discharge instructions.   Signed: Joneen Roach, AGACNP-BC [place] [facility] Pager [phone] or [phone]  09/23/2016 10:43 AM   Attending Note Agree with plan to d/c home on Breo, Spiriva. Hopefully he will only need home O2 short term post-op? Will follow with Dr Isaiah Serge for PFT, repeat walking oximetry, COPD care.   Levy Pupa, MD, PhD 09/23/2016, 12:04 PM [facility] [phone] or  if no answer [phone]

## 2016-09-24 DIAGNOSIS — J930 Spontaneous tension pneumothorax: Secondary | ICD-10-CM | POA: Diagnosis not present

## 2016-10-06 ENCOUNTER — Other Ambulatory Visit (INDEPENDENT_AMBULATORY_CARE_PROVIDER_SITE_OTHER): Payer: 59

## 2016-10-06 ENCOUNTER — Encounter: Payer: Self-pay | Admitting: Pulmonary Disease

## 2016-10-06 ENCOUNTER — Ambulatory Visit (INDEPENDENT_AMBULATORY_CARE_PROVIDER_SITE_OTHER): Payer: 59 | Admitting: Pulmonary Disease

## 2016-10-06 VITALS — BP 112/68 | HR 94 | Ht 73.0 in | Wt 185.8 lb

## 2016-10-06 DIAGNOSIS — J449 Chronic obstructive pulmonary disease, unspecified: Secondary | ICD-10-CM | POA: Diagnosis not present

## 2016-10-06 DIAGNOSIS — J8410 Pulmonary fibrosis, unspecified: Secondary | ICD-10-CM

## 2016-10-06 LAB — SEDIMENTATION RATE: Sed Rate: 23 mm/hr — ABNORMAL HIGH (ref 0–20)

## 2016-10-06 LAB — C-REACTIVE PROTEIN: CRP: 0.3 mg/dL — AB (ref 0.5–20.0)

## 2016-10-06 NOTE — Patient Instructions (Signed)
We will schedule you for a high-risk CT to follow up on the fibrosis and organizing pneumonia. We get some blood work for interstitial lung disease and alpha-1 antitrypsin levels. Continue using your inhalers as prescribed.  Return to clinic in 2 months.

## 2016-10-06 NOTE — Progress Notes (Signed)
Jay LardJoseph T Mitchell    [id number]    10/19/1956  Primary Care Physician:No primary care provider on file.  Referring Physician: No referring provider defined for this encounter.  Chief complaint:  Follow-up after recent hospitalization for spontaneous pneumothorax, COPD  HPI: Patient is a 60 year old male with reported history of COPD, remote history of tobacco use, and GE reflux disease who was hospitalized in early  nov 2017 when he developed sudden onset of left-sided chest pain and shortness of breath. At the time he was [place] business where he was attending a conference. He was evaluated in the emergency department there and found to have a large left pneumothorax with complete collapse of the left lung, due to ruptured bleb. A small bore chest tube was placed but the lung only partially reexpanded. Subsequently the initial chest tube was removed and the larger chest tube placed by a surgeon. The patient was noted to have persistent air leak, and at the patient's request he was [facility] 09[facility]16[facility]  for further management. Cardiothoracic surgery was consulted for evaluation of left sided pneumothorax and subsequently underwent VATS with multiple blebectomy, apical pleurectomy, and mechanical pleural abrasion on 11/8. Chest tube was removed 11/12. He was still requiring increased O2 (3L)and was discharged to home on O2.  Since discharge he states that his symptoms continue to improve. He is weaned himself off oxygen during the daytime with adequate O2 sats. He still continues the oxygen at night. He reports that the surgical scar is healing well with no discharge. He still has a little bit of dyspnea on exertion, pain on deep inhalation. He is now on [place] and Spiriva and this has helped his symptoms. He is a former smoker who quit in 2004. 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 He does not report any  exposures at work or at home.  Outpatient Encounter Prescriptions as of 10/06/2016  Medication Sig  . albuterol (PROVENTIL HFA;VENTOLIN HFA) 108 (90 Base) MCG/ACT inhaler Inhale 2 puffs into the lungs every 6 (six) hours as needed for wheezing or shortness of breath.  . cetirizine (ZYRTEC) 10 MG tablet Take 10 mg by mouth daily.  Marland Kitchen. esomeprazole (NEXIUM) 20 MG capsule Take 20 mg by mouth daily at 12 noon.  . fluticasone furoate-vilanterol (BREO ELLIPTA) 100-25 MCG/INH AEPB Inhale 1 puff into the lungs daily.  . Multiple Vitamins-Minerals (MULTIVITAMIN PO) Take 1 tablet by mouth daily.  . naproxen sodium (ANAPROX) 220 MG tablet Take 220 mg by mouth at bedtime.  Marland Kitchen. oxyCODONE (OXY IR/ROXICODONE) 5 MG immediate release tablet Take 5-10 mg by mouth every 4 (four) hours as needed for severe pain.  Marland Kitchen. tiotropium (SPIRIVA) 18 MCG inhalation capsule Place 1 capsule (18 mcg total) into inhaler and inhale daily.   No facility-administered encounter medications on file as of 10/06/2016.     Allergies as of 10/06/2016 - Review Complete 10/06/2016  Allergen Reaction Noted  . Meperidine hcl Nausea Only     Past Medical History:  Diagnosis Date  . COPD (chronic obstructive pulmonary disease) (HCC)   . GERD (gastroesophageal reflux disease)   . Spontaneous pneumothorax 09/15/2016   left    Past Surgical History:  Procedure Laterality Date  . VIDEO ASSISTED THORACOSCOPY Left 09/17/2016   Procedure: VIDEO ASSISTED THORACOSCOPY multiple/BLEBECTOMY, apical pleurectomy, mechanical pleural abrasion;  Surgeon: Loreli SlotSteven C Hendrickson, MD;  [facility];  Service: Thoracic;  Laterality: Left;    History reviewed. No pertinent family history.  Social History   Social History  . Marital status: Married    Spouse name: N/A  . Number of children: N/A  . Years of education: N/A   Occupational History  . Not on file.   Social History Main Topics  . Smoking status: Former Smoker    Packs/day: 1.50     Years: 25.00    Types: Cigarettes    Quit date: 2004  . Smokeless tobacco: Never Used  . Alcohol use No  . Drug use: No  . Sexual activity: Not on file   Other Topics Concern  . Not on file   Social History Narrative  . No narrative on file     Review of systems: Review of Systems  Constitutional: Negative for fever and chills.  HENT: Negative.   Eyes: Negative for blurred vision.  Respiratory: as per HPI  Cardiovascular: Negative for chest pain and palpitations.  Gastrointestinal: Negative for vomiting, diarrhea, blood per rectum. Genitourinary: Negative for dysuria, urgency, frequency and hematuria.  Musculoskeletal: Negative for myalgias, back pain and joint pain.  Skin: Negative for itching and rash.  Neurological: Negative for dizziness, tremors, focal weakness, seizures and loss of consciousness.  Endo/Heme/Allergies: Negative for environmental allergies.  Psychiatric/Behavioral: Negative for depression, suicidal ideas and hallucinations.  All other systems reviewed and are negative.   Physical Exam: Blood pressure 112/68, pulse 94, height 6\' 1"  (1.854 m), weight 185 lb 12.8 oz (84.3 kg), SpO2 97 %. Gen:      No acute distress HEENT:  EOMI, sclera anicteric Neck:     No masses; no thyromegaly Lungs:    Clear to auscultation bilaterally; normal respiratory effort CV:         Regular rate and rhythm; no murmurs Abd:      + bowel sounds; soft, non-tender; no palpable masses, no distension Ext:    No edema; adequate peripheral perfusion Skin:      Warm and dry; no rash Neuro: alert and oriented x 3 Psych: normal mood and affect  Data Reviewed: Surgical pathology 09/17/16- lung parenchyma with bullae and lung fibrosis,  bronchiolitis obliterans organizing pneumonia.  CT chest without contrast 09/16/16- Severe bullous emphysema. Pneumothrax, B/L LL consolidations R > L. All images reviewed.  Assessment:  Spontaneous tension pneumothorax s/p VATS, blebectomy, apical  pleurectomy, mechanical pleural abrasion BOOP and fibrosis on lung pathology Severe bullous emphysema COPD Ex smoker  Mr. Osowski is here for follow-up after his recent hospitalization with a pneumothorax status post VATS. He is making an excellent recovery. He is weaned himself off oxygen during the daytime and is just using oxygen at night. All images and pathology reviewed. He has impressive bullous emphysema on CT imaging that is likely secondary to smoking. He'll need an assessment for alpha-1 antitrypsin levels and phenotype as well. He'll also need PFTs but he feels that he won't be able to give full effort just yet. I will schedule him PFTs in January and reassess. In the meantime he'll continue on his current inhalers.  His lung surgical pathology shows BOOP and fibrosis. This is of unclear etiology and he will need a reevaluation with high resolution CT but I want to give him a few months to recover completely from his acute visit before reassessing. We will avoid steroids for now until the reassessment is done. I will send serologies for ILD and connective tissue disease.   Plan/Recommendations: -  High res CT, PFTs in Jan - ILD serologies, A1AT level and phenotype - Continue Breo and Spiriva.  Chilton Greathouse MD [place] [facility] Pager [phone] 10/06/2016, 9:56 AM  CC: No ref. provider found

## 2016-10-07 ENCOUNTER — Ambulatory Visit
Admission: RE | Admit: 2016-10-07 | Discharge: 2016-10-07 | Disposition: A | Discharge status: Home / Self Care | Payer: 59 | Source: Ambulatory Visit | Attending: Thoracic Surgery (Cardiothoracic Vascular Surgery) | Admitting: Thoracic Surgery (Cardiothoracic Vascular Surgery)

## 2016-10-07 ENCOUNTER — Ambulatory Visit (INDEPENDENT_AMBULATORY_CARE_PROVIDER_SITE_OTHER): Payer: 59 | Admitting: Thoracic Surgery (Cardiothoracic Vascular Surgery)

## 2016-10-07 ENCOUNTER — Encounter: Payer: Self-pay | Admitting: Thoracic Surgery (Cardiothoracic Vascular Surgery)

## 2016-10-07 ENCOUNTER — Other Ambulatory Visit: Payer: Self-pay | Admitting: Thoracic Surgery (Cardiothoracic Vascular Surgery)

## 2016-10-07 VITALS — BP 116/79 | HR 86 | Resp 16 | Ht 73.0 in | Wt 185.0 lb

## 2016-10-07 DIAGNOSIS — J939 Pneumothorax, unspecified: Secondary | ICD-10-CM

## 2016-10-07 DIAGNOSIS — Z09 Encounter for follow-up examination after completed treatment for conditions other than malignant neoplasm: Secondary | ICD-10-CM

## 2016-10-07 DIAGNOSIS — J439 Emphysema, unspecified: Secondary | ICD-10-CM

## 2016-10-07 LAB — RHEUMATOID FACTOR: Rhuematoid fact SerPl-aCnc: <14 IU/mL (ref <14)

## 2016-10-07 LAB — CK TOTAL AND CKMB (NOT AT ARMC): CK TOTAL: 45 U/L (ref 7–232)

## 2016-10-07 LAB — ANTI-DNA ANTIBODY, DOUBLE-STRANDED: ds DNA Ab: 1 IU/mL

## 2016-10-07 LAB — JO-1 ANTIBODY-IGG: Jo-1 Antibody, IgG: <1

## 2016-10-07 LAB — ANCA SCREEN W REFLEX TITER: ANCA Screen: NEGATIVE

## 2016-10-07 LAB — ANTI-SMITH ANTIBODY: ENA SM AB SER-ACNC: NEGATIVE

## 2016-10-07 LAB — CYCLIC CITRUL PEPTIDE ANTIBODY, IGG: Cyclic Citrullin Peptide Ab: <16 Units

## 2016-10-07 LAB — CENTROMERE ANTIBODIES: CENTROMERE AB SCREEN: NEGATIVE

## 2016-10-07 LAB — SJOGREN'S SYNDROME ANTIBODS(SSA + SSB)
SSA (RO) (ENA) ANTIBODY, IGG: NEGATIVE
SSB (LA) (ENA) ANTIBODY, IGG: NEGATIVE

## 2016-10-07 LAB — RNP ANTIBODY: Ribonucleic Protein(ENA) Antibody, IgG: 1 — ABNORMAL HIGH

## 2016-10-07 LAB — ANTI-SCLERODERMA ANTIBODY: SCLERODERMA (SCL-70) (ENA) ANTIBODY, IGG: NEGATIVE

## 2016-10-07 NOTE — Progress Notes (Signed)
THREE SUTURES REMOVED FROM TWO PREVIOUS CHEST TUBE SITES. ALL OP INCISIONS VERY WELL HEALED.

## 2016-10-07 NOTE — Progress Notes (Signed)
[address]       [place] [id number]             [phone]       HPI: Jay Mitchell returns for a scheduled postoperative follow-up visit  He is a 60 year old man with a history of tobacco abuse and COPD. He developed a left spontaneous [place] [facility]. A chest tube was placed in was [facility] CT showed severe bullous emphysematous disease bilaterally. I did a left VATS, lobectomy, and pleural abrasion on 09/17/2016.  Postoperative course was unremarkable and he went home on postoperative day #6.  Of note his pathology showed blebs but also BOOP in the adjacent lung period.  He saw Dr. Isaiah SergeMannam yesterday  He hasn't taken oxycodone about 3 days. He is taking Aleve twice a day. He doesn't have pain at the incision, but more of a numbness with occasional pins and needles. It is still tender to touch around the incision. He has not had any new respiratory issues since discharge. He is no longer using oxygen during the day, but is still using an at night.  Past Medical History:  Diagnosis Date  . COPD (chronic obstructive pulmonary disease) (HCC)   . GERD (gastroesophageal reflux disease)   . Spontaneous pneumothorax 09/15/2016   left    Current Outpatient Prescriptions  Medication Sig Dispense Refill  . albuterol (PROVENTIL HFA;VENTOLIN HFA) 108 (90 Base) MCG/ACT inhaler Inhale 2 puffs into the lungs every 6 (six) hours as needed for wheezing or shortness of breath. 1 Inhaler 2  . cetirizine (ZYRTEC) 10 MG tablet Take 10 mg by mouth daily.    Marland Kitchen. esomeprazole (NEXIUM) 20 MG capsule Take 20 mg by mouth daily at 12 noon.    . fluticasone furoate-vilanterol (BREO ELLIPTA) 100-25 MCG/INH AEPB Inhale 1 puff into the lungs daily. 30 each 1  . Multiple Vitamins-Minerals (MULTIVITAMIN PO) Take 1 tablet by mouth daily.    . naproxen sodium (ANAPROX) 220 MG tablet Take 220 mg by mouth at bedtime.    Marland Kitchen. tiotropium (SPIRIVA) 18 MCG  inhalation capsule Place 1 capsule (18 mcg total) into inhaler and inhale daily. 30 capsule 12  . oxyCODONE (OXY IR/ROXICODONE) 5 MG immediate release tablet Take 5-10 mg by mouth every 4 (four) hours as needed for severe pain.     No current facility-administered medications for this visit.     Physical Exam BP 116/79 (BP Location: Right Arm, Patient Position: Sitting, Cuff Size: Large)   Pulse 86   Resp 16   Ht 6\' 1"  (1.854 m)   Wt 185 lb (83.9 kg)   SpO2 97% Comment: ON RA  BMI 24.7141 kg/m   60 year old man in no acute distress Alert and oriented 3 with no focal deficits Lungs diminished but equal breath sounds bilaterally, no wheezing Incisions clean dry and intact Cardiac regular rate and rhythm normal S1 and S2  Diagnostic Tests: CHEST  2 VIEW  COMPARISON:  09/23/2016  FINDINGS: Postoperative changes on the left. No pneumothorax. Scarring or atelectasis at the left base and apex with volume loss in the left lung. Right lung is clear. Heart is normal size appear  IMPRESSION: Postoperative changes and volume loss on the left. No pneumothorax. Left apical and left basilar scarring or atelectasis.   Electronically Signed   By: Charlett NoseKevin  Mitchell M.D.   On: 10/07/2016 13:29 I personally reviewed the chest x-ray and concur with the findings above  Impression:  Jay Mitchell is 60 year old man with a history of tobacco abuse and COPD. He presented with spontaneous pneumothorax. He had a left VATS, blebectomy, and pleural abrasion on 09/17/2016.  He is doing well at this point in time. He has some discomfort, but that is controlled with Aleve and he has not requiring any narcotics.  He was discharged on home oxygen. He still using that at night but does not needed during the day.   He saw Dr. Isaiah SergeMannam who plans to do a high-resolution CT in about 6-8 weeks to further evaluate his underlying lung pathology.   He may resume driving. His activities are otherwise unrestricted,  but take caution him to build into new activities slowly.   Plan: I will be happy to see Jay Mitchell back any time if I can be of any further assistance with his care   Loreli SlotSteven C Denvil Canning, MD [facility] [phone]

## 2016-10-08 LAB — ALDOLASE: ALDOLASE: 4 U/L (ref <=8.1)

## 2016-10-13 LAB — ANA W/REFLEX: ANA: POSITIVE — AB

## 2016-10-13 LAB — ANA COMPREHENSIVE PANEL
Anti JO-1: <0.2 AI (ref 0.0–0.9)
CHROMATIN AB SERPL-ACNC: 0.2 AI (ref 0.0–0.9)
Centromere Ab Screen: <0.2 AI (ref 0.0–0.9)
DSDNA AB: 1 [IU]/mL (ref 0–9)
ENA RNP AB: 1.1 AI — AB (ref 0.0–0.9)
ENA SM AB SER-ACNC: 0.4 AI (ref 0.0–0.9)
ENA SSA (RO) Ab: 0.5 AI (ref 0.0–0.9)
ENA SSB (LA) Ab: <0.2 AI (ref 0.0–0.9)
Scleroderma SCL-70: <0.2 AI (ref 0.0–0.9)

## 2016-10-13 LAB — HYPERSENSITIVITY PNEUMONITIS
A. Pullulans Abs: NEGATIVE
A.Fumigatus #1 Abs: NEGATIVE
Micropolyspora faeni, IgG: NEGATIVE
PIGEON SERUM ABS: NEGATIVE
Thermoact. Saccharii: NEGATIVE
Thermoactinomyces vulgaris, IgG: NEGATIVE

## 2016-10-13 LAB — MYOSITIS PANEL III
EJ*: NEGATIVE
JO-1 (WB): NEGATIVE
Ku*: NEGATIVE
MI-2 ANTIBODIES: NEGATIVE
OJ: NEGATIVE
PL-12: NEGATIVE
PL-7: NEGATIVE
PM-SCL 75: NEGATIVE
PM-Scl 100*: POSITIVE — AB
RNP: 11.8 U/mL
RO-52: NEGATIVE
Signal Recognition Particle*: NEGATIVE

## 2016-11-23 DIAGNOSIS — J449 Chronic obstructive pulmonary disease, unspecified: Secondary | ICD-10-CM | POA: Diagnosis not present

## 2016-11-25 ENCOUNTER — Ambulatory Visit (INDEPENDENT_AMBULATORY_CARE_PROVIDER_SITE_OTHER)
Admission: RE | Admit: 2016-11-25 | Discharge: 2016-11-25 | Disposition: A | Discharge status: Home / Self Care | Payer: 59 | Source: Ambulatory Visit | Attending: Pulmonary Disease | Admitting: Pulmonary Disease

## 2016-11-25 DIAGNOSIS — J8410 Pulmonary fibrosis, unspecified: Secondary | ICD-10-CM | POA: Diagnosis not present

## 2016-11-25 DIAGNOSIS — J439 Emphysema, unspecified: Secondary | ICD-10-CM | POA: Diagnosis not present

## 2016-11-27 ENCOUNTER — Ambulatory Visit: Payer: 59 | Admitting: Pulmonary Disease

## 2016-11-28 ENCOUNTER — Telehealth: Payer: Self-pay | Admitting: *Deleted

## 2016-11-28 MED ORDER — FLUTICASONE FUROATE-VILANTEROL 100-25 MCG/INH IN AEPB: 1.0000 | INHALATION_SPRAY | Freq: Every day | RESPIRATORY_TRACT | 0 refills | Status: DC

## 2016-11-28 NOTE — Telephone Encounter (Signed)
Pt requesting samples of Breo 100 2 samples obtained for patient and documented per protocol Pt is aware, samples given to patient's niece to deliver Nothing further needed; will sign off

## 2016-12-02 ENCOUNTER — Other Ambulatory Visit: Payer: Self-pay

## 2016-12-02 DIAGNOSIS — R918 Other nonspecific abnormal finding of lung field: Secondary | ICD-10-CM

## 2016-12-09 ENCOUNTER — Other Ambulatory Visit: Payer: Self-pay | Admitting: Pulmonary Disease

## 2016-12-09 DIAGNOSIS — R0600 Dyspnea, unspecified: Secondary | ICD-10-CM

## 2016-12-10 ENCOUNTER — Ambulatory Visit (INDEPENDENT_AMBULATORY_CARE_PROVIDER_SITE_OTHER): Payer: 59 | Admitting: Pulmonary Disease

## 2016-12-10 DIAGNOSIS — R0600 Dyspnea, unspecified: Secondary | ICD-10-CM | POA: Diagnosis not present

## 2016-12-10 LAB — PULMONARY FUNCTION TEST
DL/VA % pred: 57 %
DL/VA: 2.7 ml/min/mmHg/L
DLCO COR: 15.43 ml/min/mmHg
DLCO UNC: 15.55 ml/min/mmHg
DLCO cor % pred: 44 %
DLCO unc % pred: 44 %
FEF 25-75 POST: 1.33 L/s
FEF 25-75 Pre: 1.31 L/sec
FEF2575-%Change-Post: 1 %
FEF2575-%PRED-POST: 42 %
FEF2575-%PRED-PRE: 42 %
FEV1-%CHANGE-POST: 0 %
FEV1-%PRED-POST: 66 %
FEV1-%Pred-Pre: 65 %
FEV1-POST: 2.55 L
FEV1-Pre: 2.53 L
FEV1FVC-%Change-Post: 0 %
FEV1FVC-%PRED-PRE: 86 %
FEV6-%Change-Post: 0 %
FEV6-%PRED-POST: 79 %
FEV6-%Pred-Pre: 78 %
FEV6-Post: 3.83 L
FEV6-Pre: 3.81 L
FEV6FVC-%CHANGE-POST: 0 %
FEV6FVC-%Pred-Post: 103 %
FEV6FVC-%Pred-Pre: 103 %
FVC-%Change-Post: 0 %
FVC-%Pred-Post: 76 %
FVC-%Pred-Pre: 76 %
FVC-Post: 3.9 L
FVC-Pre: 3.89 L
PRE FEV1/FVC RATIO: 65 %
PRE FEV6/FVC RATIO: 99 %
Post FEV1/FVC ratio: 65 %
Post FEV6/FVC ratio: 99 %
RV % pred: 74 %
RV: 1.77 L
TLC % pred: 81 %
TLC: 6.06 L

## 2016-12-12 ENCOUNTER — Telehealth: Payer: Self-pay | Admitting: Pulmonary Disease

## 2016-12-12 ENCOUNTER — Ambulatory Visit (INDEPENDENT_AMBULATORY_CARE_PROVIDER_SITE_OTHER): Payer: 59 | Admitting: Pulmonary Disease

## 2016-12-12 ENCOUNTER — Encounter: Payer: Self-pay | Admitting: Pulmonary Disease

## 2016-12-12 ENCOUNTER — Other Ambulatory Visit: Payer: 59

## 2016-12-12 VITALS — BP 128/80 | HR 84 | Ht 72.0 in | Wt 192.2 lb

## 2016-12-12 DIAGNOSIS — J930 Spontaneous tension pneumothorax: Secondary | ICD-10-CM

## 2016-12-12 DIAGNOSIS — J439 Emphysema, unspecified: Secondary | ICD-10-CM

## 2016-12-12 DIAGNOSIS — J449 Chronic obstructive pulmonary disease, unspecified: Secondary | ICD-10-CM

## 2016-12-12 MED ORDER — FLUTICASONE FUROATE-VILANTEROL 100-25 MCG/INH IN AEPB
1.0000 | INHALATION_SPRAY | Freq: Every day | RESPIRATORY_TRACT | 3 refills | Status: DC
Start: 2016-12-12 — End: 2017-03-28

## 2016-12-12 NOTE — Patient Instructions (Signed)
We'll check an alpha 1 antitrypsin level and phenotype Continue using the inhalers , Breo and Spiriva as prescribed All your lab results, CT scan and pulmonary function tests were reviewed with you today.  To clinic in 3 months

## 2016-12-12 NOTE — Progress Notes (Signed)
Jay Mitchell    [id number]    [birth year]  Primary Care Physician:James John, MD  Referring Physician: Corwin Levins, MD [address] [place], [place] [address]  Chief complaint:  Follow-up after recent hospitalization for spontaneous pneumothorax, COPD  HPI: Patient is a 61 year old male with reported history of COPD, remote history of tobacco use, and GE reflux disease who was hospitalized in early  nov 2017 when he developed sudden onset of left-sided chest pain and shortness of breath. At the time he was traveling in the [place] part of [place] for business where he was attending a conference. He was evaluated in the emergency department there and found to have a large left pneumothorax with complete collapse of the left lung, due to ruptured bleb. A small bore chest tube was placed but the lung only partially reexpanded. Subsequently the initial chest tube was removed and the larger chest tube placed by a surgeon. The patient was noted to have persistent air leak, and at the patient's request he was transferred to [facility] on 09/16/16  for further management. Cardiothoracic surgery was consulted for evaluation of left sided pneumothorax and subsequently underwent VATS with multiple blebectomy, apical pleurectomy, and mechanical pleural abrasion on 11/8. Chest tube was removed 11/12. He was still requiring increased O2 (3L)and was discharged to home on O2. He is a former smoker who quit in 2004. 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  Interim history: Jay Mitchell continues to do well. He stable on Breo and Spiriva. He is off oxygen during the daytime but continues it at night. He still has residual dyspnea on exertion which is unchanged. He denies any cough, sputum production, fevers, chills. He has occasional hoarseness of voice and remembers to rinse his mouth after every use of his  inhalers.  Since discharge he states that his symptoms continue to improve. He is weaned himself off oxygen during the daytime with adequate O2 sats. He still continues the oxygen at night. He reports that the surgical scar is healing well with no discharge. He still has a little bit of dyspnea on exertion, pain on deep inhalation. He is now on [place] and this has helped his symptoms.   Outpatient Encounter Prescriptions as of 12/12/2016  Medication Sig  . albuterol (PROVENTIL HFA;VENTOLIN HFA) 108 (90 Base) MCG/ACT inhaler Inhale 2 puffs into the lungs every 6 (six) hours as needed for wheezing or shortness of breath.  . cetirizine (ZYRTEC) 10 MG tablet Take 10 mg by mouth daily.  . diphenhydramine-acetaminophen (TYLENOL PM) 25-500 MG TABS tablet Take 1 tablet by mouth at bedtime as needed.  Marland Kitchen esomeprazole (NEXIUM) 20 MG capsule Take 20 mg by mouth daily at 12 noon.  . fluticasone furoate-vilanterol (BREO ELLIPTA) 100-25 MCG/INH AEPB Inhale 1 puff into the lungs daily.  . fluticasone furoate-vilanterol (BREO ELLIPTA) 100-25 MCG/INH AEPB Inhale 1 puff into the lungs daily.  . Multiple Vitamins-Minerals (MULTIVITAMIN PO) Take 1 tablet by mouth daily.  . naproxen sodium (ANAPROX) 220 MG tablet Take 220 mg by mouth at bedtime.  Marland Kitchen tiotropium (SPIRIVA) 18 MCG inhalation capsule Place 1 capsule (18 mcg total) into inhaler and inhale daily.  . [DISCONTINUED] oxyCODONE (OXY IR/ROXICODONE) 5 MG immediate release tablet Take 5-10 mg by mouth every 4 (four) hours as needed for severe pain.   No  facility-administered encounter medications on file as of 12/12/2016.     Allergies as of 12/12/2016 - Review Complete 12/12/2016  Allergen Reaction Noted  . Meperidine hcl Nausea Only     Past Medical History:  Diagnosis Date  . COPD (chronic obstructive pulmonary disease) (HCC)   . GERD (gastroesophageal reflux disease)   . Spontaneous pneumothorax 09/15/2016   left    Past Surgical History:   Procedure Laterality Date  . VIDEO ASSISTED THORACOSCOPY Left 09/17/2016   Procedure: VIDEO ASSISTED THORACOSCOPY multiple/BLEBECTOMY, apical pleurectomy, mechanical pleural abrasion;  Surgeon: Loreli Slot, MD;  Location: [facility] OR;  Service: Thoracic;  Laterality: Left;    No family history on file.  Social History   Social History  . Marital status: Married    Spouse name: N/A  . Number of children: N/A  . Years of education: N/A   Occupational History  . Not on file.   Social History Main Topics  . Smoking status: Former Smoker    Packs/day: 1.50    Years: 25.00    Types: Cigarettes    Quit date: 2004  . Smokeless tobacco: Never Used  . Alcohol use No  . Drug use: No  . Sexual activity: Not on file   Other Topics Concern  . Not on file   Social History Narrative  . No narrative on file   Review of systems: Review of Systems  Constitutional: Negative for fever and chills.  HENT: Negative.   Eyes: Negative for blurred vision.  Respiratory: as per HPI  Cardiovascular: Negative for chest pain and palpitations.  Gastrointestinal: Negative for vomiting, diarrhea, blood per rectum. Genitourinary: Negative for dysuria, urgency, frequency and hematuria.  Musculoskeletal: Negative for myalgias, back pain and joint pain.  Skin: Negative for itching and rash.  Neurological: Negative for dizziness, tremors, focal weakness, seizures and loss of consciousness.  Endo/Heme/Allergies: Negative for environmental allergies.  Psychiatric/Behavioral: Negative for depression, suicidal ideas and hallucinations.  All other systems reviewed and are negative.  Physical Exam: Blood pressure 128/80, pulse 84, height 6' (1.829 m), weight 192 lb 3.2 oz (87.2 kg), SpO2 96 %. Gen:      No acute distress HEENT:  EOMI, sclera anicteric Neck:     No masses; no thyromegaly Lungs:    Clear to auscultation bilaterally; normal respiratory effort CV:         Regular rate and rhythm; no  murmurs Abd:      + bowel sounds; soft, non-tender; no palpable masses, no distension Ext:    No edema; adequate peripheral perfusion Skin:      Warm and dry; no rash Neuro: alert and oriented x 3 Psych: normal mood and affect  Data Reviewed: Surgical pathology 09/17/16- lung parenchyma with bullae and lung fibrosis,  bronchiolitis obliterans organizing pneumonia.  CT chest without contrast 09/16/16- Severe bullous emphysema. Pneumothrax, B/L LL consolidations R > L.  High-resolution CT of the chest 11/25/16-  no evidence of interstitial lung disease, postsurgical scarring. Several subcentimeter scattered pulmonary lymph nodes. Moderate-severe emphysema. All images personally reviewed  PFTs 12/10/16 FVC 2.90 (76%) FEV1 2.55 [66%] F/F 65 TLC 81% DLCO 44% Moderate obstructive airway disease, severe diffusion defect.  Immune serologies 10/06/16 ANA positive, ENA RNP positive  Assessment:  Spontaneous tension pneumothorax s/p VATS, blebectomy, apical pleurectomy, mechanical pleural abrasion BOOP and fibrosis on lung pathology > resolved Severe bullous emphysema COPD Ex smoker  Jay Mitchell is here for follow-up after recent hospitalization with a pneumothorax status post VATS. His lung  pathology shows Boop and fibrosis. I think this is secondary to the acute inflammation, chest tube placement during that admission. A follow-up CT scan has shown resolution of any interstitial opacities. An autoimmune workup is negative.  His PFTs and lab work were reviewed with him today. He continues on his Breo and Spiriva but no issues . It at night. I'll send off on antitrypsin levels and phenotype for a complete workup  Plan/Recommendations: - Continue Breo and Spiriva. - Check A1AT levels and phenotype  Chilton GreathousePraveen Akire Rennert MD [facility] Pager [phone] 12/12/2016, 3:18 PM  CC: Corwin LevinsJohn, James W, MD

## 2016-12-12 NOTE — Telephone Encounter (Signed)
Notes Recorded by Chilton GreathousePraveen Mannam, MD on 12/11/2016 at 5:31 PM EST Please let the patient know that the PFTs confirm moderate emphysema. No change to therapy  Pt aware of results and voiced his understanding. Nothing further needed.

## 2016-12-18 LAB — ALPHA-1 ANTITRYPSIN PHENOTYPE: A1 ANTITRYPSIN: 147 mg/dL (ref 83–199)

## 2016-12-24 DIAGNOSIS — J449 Chronic obstructive pulmonary disease, unspecified: Secondary | ICD-10-CM | POA: Diagnosis not present

## 2017-01-21 DIAGNOSIS — J449 Chronic obstructive pulmonary disease, unspecified: Secondary | ICD-10-CM | POA: Diagnosis not present

## 2017-02-10 ENCOUNTER — Telehealth: Payer: Self-pay | Admitting: Pulmonary Disease

## 2017-02-10 DIAGNOSIS — J449 Chronic obstructive pulmonary disease, unspecified: Secondary | ICD-10-CM

## 2017-02-10 NOTE — Telephone Encounter (Signed)
Spoke with pt, states he is wearing his 02 only qhs and not during the day.  Pt is paying for a home fill system when he just needs a home concentrator for qhs use.   Pt is requesting an order to pick up daytime 02.   PM ok to change order to qhs only?  Thanks!

## 2017-02-11 NOTE — Telephone Encounter (Signed)
Spoke with pt and made him aware that PM gave the ok for the order. The order was placed. Pt had no further questions. Nothing further is needed at this time.

## 2017-02-11 NOTE — Telephone Encounter (Signed)
Ok to change the O2 order to qHS only

## 2017-02-21 DIAGNOSIS — J449 Chronic obstructive pulmonary disease, unspecified: Secondary | ICD-10-CM | POA: Diagnosis not present

## 2017-03-23 DIAGNOSIS — J449 Chronic obstructive pulmonary disease, unspecified: Secondary | ICD-10-CM | POA: Diagnosis not present

## 2017-03-28 ENCOUNTER — Other Ambulatory Visit: Payer: Self-pay | Admitting: Pulmonary Disease

## 2017-04-01 ENCOUNTER — Ambulatory Visit (INDEPENDENT_AMBULATORY_CARE_PROVIDER_SITE_OTHER): Payer: 59 | Admitting: Pulmonary Disease

## 2017-04-01 ENCOUNTER — Encounter: Payer: Self-pay | Admitting: Pulmonary Disease

## 2017-04-01 VITALS — BP 132/90 | HR 88 | Ht 72.0 in | Wt 195.0 lb

## 2017-04-01 DIAGNOSIS — J930 Spontaneous tension pneumothorax: Secondary | ICD-10-CM

## 2017-04-01 DIAGNOSIS — J449 Chronic obstructive pulmonary disease, unspecified: Secondary | ICD-10-CM | POA: Diagnosis not present

## 2017-04-01 MED ORDER — TIOTROPIUM BROMIDE MONOHYDRATE 2.5 MCG/ACT IN AERS: 2.0000 | INHALATION_SPRAY | Freq: Every day | RESPIRATORY_TRACT | 0 refills | Status: DC

## 2017-04-01 NOTE — Patient Instructions (Addendum)
I feel you can probably come off the spiriva altogether and just continue the breo.  We will give a sample of spiriva respimat to use if your breathing gets worse and you need to restart the spiriva. We will call with the results of the CT scan to follow up lung nodules  Return in 6 months

## 2017-04-01 NOTE — Progress Notes (Signed)
Jay LardJoseph T Deckard    [id number]    08/04/1956  Primary Care Physician:John, Len BlalockJames W, MD  Referring Physician: Corwin LevinsJohn, James W, MD [address] [place], [place] [id number]  Chief complaint:  Follow-up for spontaneous pneumothorax, COPD  HPI: Patient is a 61 year old male with reported history of COPD, remote history of tobacco use, and GE reflux disease who was hospitalized in early  nov 2017 when he developed sudden onset of left-sided chest pain and shortness of breath. At the time he was [place] for business where he was attending a conference. He was evaluated in the emergency department there and found to have a large left pneumothorax with complete collapse of the left lung, due to ruptured bleb. A small bore chest tube was placed but the lung only partially reexpanded. Subsequently the initial chest tube was removed and the larger chest tube placed by a surgeon. The patient was noted to have persistent air leak, and at the patient's request he was transferred [facility] 09[facility]16[facility]16  for further management. Cardiothoracic surgery was consulted for evaluation of left sided pneumothorax and subsequently underwent VATS with multiple blebectomy, apical pleurectomy, and mechanical pleural abrasion on 11/8. Chest tube was removed 11/12. He was still requiring increased O2 (3L)and was discharged to home on O2. He is a former smoker who quit in 2004. 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 He does not report any exposures at work or at home.  Interim history: Doing well post discharge. He continues on breo and spiriva. Reports that the Spiriva respimat dries up his mouth. Denies any dyspnea, cough, sputum production. He is off oxygen during the daytime but continues it at night. He has issues with pain in the left knee that is limiting his mobility.  Outpatient Encounter Prescriptions as of 04/01/2017  Medication Sig  .  albuterol (PROVENTIL HFA;VENTOLIN HFA) 108 (90 Base) MCG/ACT inhaler Inhale 2 puffs into the lungs every 6 (six) hours as needed for wheezing or shortness of breath.  Marland Kitchen. BREO ELLIPTA 100-25 MCG/INH AEPB INHALE 1 PUFF INTO THE LUNGS DAILY.  . cetirizine (ZYRTEC) 10 MG tablet Take 10 mg by mouth daily.  . diphenhydramine-acetaminophen (TYLENOL PM) 25-500 MG TABS tablet Take 1 tablet by mouth at bedtime as needed.  Marland Kitchen. esomeprazole (NEXIUM) 20 MG capsule Take 20 mg by mouth daily at 12 noon.  . fluticasone furoate-vilanterol (BREO ELLIPTA) 100-25 MCG/INH AEPB Inhale 1 puff into the lungs daily.  . Multiple Vitamins-Minerals (MULTIVITAMIN PO) Take 1 tablet by mouth daily.  . naproxen sodium (ANAPROX) 220 MG tablet Take 220 mg by mouth at bedtime.  Marland Kitchen. tiotropium (SPIRIVA) 18 MCG inhalation capsule Place 1 capsule (18 mcg total) into inhaler and inhale daily.   No facility-administered encounter medications on file as of 04/01/2017.     Allergies as of 04/01/2017 - Review Complete 04/01/2017  Allergen Reaction Noted  . Meperidine hcl Nausea Only     Past Medical History:  Diagnosis Date  . COPD (chronic obstructive pulmonary disease) (HCC)   . GERD (gastroesophageal reflux disease)   . Spontaneous pneumothorax 09/15/2016   left    Past Surgical History:  Procedure Laterality Date  . VIDEO ASSISTED THORACOSCOPY Left 09/17/2016   Procedure: VIDEO ASSISTED THORACOSCOPY multiple/BLEBECTOMY, apical pleurectomy, mechanical pleural abrasion;  Surgeon: Loreli SlotSteven C Hendrickson, MD;  [facility];  Service: Thoracic;  Laterality: Left;  No family history on file.  Social History   Social History  . Marital status: Married    Spouse name: N/A  . Number of children: N/A  . Years of education: N/A   Occupational History  . Not on file.   Social History Main Topics  . Smoking status: Former Smoker    Packs/day: 1.50    Years: 25.00    Types: Cigarettes    Quit date: 2004  . Smokeless  tobacco: Never Used  . Alcohol use No  . Drug use: No  . Sexual activity: Not on file   Other Topics Concern  . Not on file   Social History Narrative  . No narrative on file   Review of systems: Review of Systems  Constitutional: Negative for fever and chills.  HENT: Negative.   Eyes: Negative for blurred vision.  Respiratory: as per HPI  Cardiovascular: Negative for chest pain and palpitations.  Gastrointestinal: Negative for vomiting, diarrhea, blood per rectum. Genitourinary: Negative for dysuria, urgency, frequency and hematuria.  Musculoskeletal: Negative for myalgias, back pain and joint pain.  Skin: Negative for itching and rash.  Neurological: Negative for dizziness, tremors, focal weakness, seizures and loss of consciousness.  Endo/Heme/Allergies: Negative for environmental allergies.  Psychiatric/Behavioral: Negative for depression, suicidal ideas and hallucinations.  All other systems reviewed and are negative.  Physical Exam: Blood pressure 132/90, pulse 88, height 6' (1.829 m), weight 195 lb (88.5 kg), SpO2 97 %. Gen:      No acute distress HEENT:  EOMI, sclera anicteric Neck:     No masses; no thyromegaly Lungs:    Clear to auscultation bilaterally; normal respiratory effort CV:         Regular rate and rhythm; no murmurs Abd:      + bowel sounds; soft, non-tender; no palpable masses, no distension Ext:    No edema; adequate peripheral perfusion Skin:      Warm and dry; no rash Neuro: alert and oriented x 3 Psych: normal mood and affect  Data Reviewed: Surgical pathology 09/17/16- lung parenchyma with bullae and lung fibrosis,  bronchiolitis obliterans organizing pneumonia.  CT chest without contrast 09/16/16- Severe bullous emphysema. Pneumothrax, B/L LL consolidations R > L.  High-resolution CT of the chest 11/25/16-  no evidence of interstitial lung disease, postsurgical scarring. Several subcentimeter scattered pulmonary lymph nodes. Moderate-severe  emphysema. All images personally reviewed  PFTs 12/10/16 FVC 2.90 (76%) FEV1 2.55 [66%] F/F 65 TLC 81% DLCO 44% Moderate obstructive airway disease, severe diffusion defect.  Immune serologies 10/06/16 ANA positive, ENA RNP positive  A1 AT 12/12/16- 147, PIMM phenotype.  Assessment:  Spontaneous tension pneumothorax s/p VATS, blebectomy, apical pleurectomy, mechanical pleural abrasion BOOP and fibrosis on lung pathology > resolved Severe bullous emphysema COPD Ex smoker Mr. Urbanczyk is here for follow-up after hospitalization with a pneumothorax status post VATS. His lung pathology shows Boop and fibrosis. I think this is secondary to the acute inflammation, chest tube placement during that admission. A follow-up CT scan has shown resolution of any interstitial opacities. An autoimmune workup shows elevation in ANA and ENA which is likely nonspecific.  Reports dry mouth with Spiriva respimat. I'm not sure he needs to be on triple inhalers therapy and we can probably stop the Spiriva. If symptoms worsen with this then we can switch him over to [employer].  Lung nodules Subcentimeter pulmonary nodules noted on last CT scan. We'll follow up with a 6 month CT scan.  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 - Follow CT scan (Already scheduled for June 2018)  Chilton Greathouse MD [place] [facility] [facility] Pager [phone] 04/01/2017, 12:35 PM  CC: Corwin Levins, MD

## 2017-04-23 DIAGNOSIS — J449 Chronic obstructive pulmonary disease, unspecified: Secondary | ICD-10-CM | POA: Diagnosis not present

## 2017-05-08 ENCOUNTER — Ambulatory Visit (INDEPENDENT_AMBULATORY_CARE_PROVIDER_SITE_OTHER)
Admission: RE | Admit: 2017-05-08 | Discharge: 2017-05-08 | Disposition: A | Discharge status: Home / Self Care | Payer: 59 | Source: Ambulatory Visit | Attending: Pulmonary Disease | Admitting: Pulmonary Disease

## 2017-05-08 DIAGNOSIS — R918 Other nonspecific abnormal finding of lung field: Secondary | ICD-10-CM | POA: Diagnosis not present

## 2017-05-11 ENCOUNTER — Ambulatory Visit (INDEPENDENT_AMBULATORY_CARE_PROVIDER_SITE_OTHER): Payer: 59 | Admitting: Internal Medicine

## 2017-05-11 ENCOUNTER — Telehealth: Payer: Self-pay | Admitting: Internal Medicine

## 2017-05-11 ENCOUNTER — Encounter: Payer: Self-pay | Admitting: Internal Medicine

## 2017-05-11 VITALS — BP 120/84 | HR 77 | Ht 72.0 in | Wt 193.6 lb

## 2017-05-11 DIAGNOSIS — J310 Chronic rhinitis: Secondary | ICD-10-CM

## 2017-05-11 DIAGNOSIS — J449 Chronic obstructive pulmonary disease, unspecified: Secondary | ICD-10-CM | POA: Diagnosis not present

## 2017-05-11 MED ORDER — MOMETASONE FUROATE 50 MCG/ACT NA SUSP: 2.0000 | Freq: Every day | NASAL | 11 refills | Status: DC

## 2017-05-11 MED ORDER — FLUTICASONE PROPIONATE 50 MCG/ACT NA SUSP: 1.0000 | Freq: Two times a day (BID) | NASAL | 2 refills | Status: DC

## 2017-05-11 MED ORDER — AMOXICILLIN-POT CLAVULANATE 875-125 MG PO TABS: 1.0000 | ORAL_TABLET | Freq: Two times a day (BID) | ORAL | 0 refills | Status: AC

## 2017-05-11 MED ORDER — PREDNISONE 10 MG PO TABS: ORAL_TABLET | ORAL | 0 refills | Status: DC

## 2017-05-11 NOTE — Telephone Encounter (Signed)
MW- please advise on alternative for nasonex, his ins is denying this

## 2017-05-11 NOTE — Patient Instructions (Addendum)
Prednisone 10 mg take  4 each am x 2 days,   2 each am x 2 days,  1 each am x 2 days and stop   Nasonex 2 puffs every 12 hours each nostril   Augmentin 875 mg take one pill twice daily  X 10 days - take at breakfast and supper with large glass of water.  It would help reduce the usual side effects (diarrhea and yeast infections) if you ate cultured yogurt at lunch.   I emphasized that nasal steroids(nasonex)  have no immediate benefit in terms of improving symptoms.  To help them reached the target tissue, the patient should use Afrin two puffs every 12 hours applied one min before using the nasal steroids.  Afrin should be stopped after no more than 5 days.  If the symptoms worsen, Afrin can be restarted after 5 days off of therapy to prevent rebound congestion from overuse of Afrin.  I also emphasized that in no way are nasal steroids a concern in terms of "addiction".   nexium should be 30 min before supper   Remember to blow the breo out thru your nose

## 2017-05-11 NOTE — Telephone Encounter (Signed)
Spoke with MW- call in flonase 1-2 sprays each nostril bid  Spoke with the pt and notified of this  [facility]

## 2017-05-11 NOTE — Progress Notes (Signed)
Jay LardJoseph T Mitchell    [id number]    11/19/1955    Brief patient profile:  60 yowm quit smoking 2004 with GOLD II copd/chronic rhinitis  maint on BREO/ zyrtec    05/11/2017 acute extended ov/Lashante Fryberger re: rhinitis flare  Chief Complaint  Patient presents with  . Acute Visit    Pt c/o nasal congestion and cough for the past 2 days. Cough is prod with clear sputum. He is using albuterol 2 x per wk on average.   tried afrin and sudaphed no better  Has had in past but much worse x 2 days with assoc cough worse at hs and in am   Not limited by breathing from desired activities    No obvious day to day or daytime variability or assoc  purulent sputum or mucus plugs or hemoptysis or cp or chest tightness, subjective wheeze or overt   hb symptoms. No unusual exp hx or h/o childhood pna/ asthma or knowledge of premature birth.  Sleeping ok without nocturnal  or early am exacerbation  of respiratory  c/o's or need for noct saba. Also denies any obvious fluctuation of symptoms with weather or environmental changes or other aggravating or alleviating factors except as outlined above   Current Medications, Allergies, Complete Past Medical History, Past Surgical History, Family History, and Social History were reviewed in [facility] electronic medical record.  ROS  The following are not active complaints unless bolded sore throat, dysphagia, dental problems, itching, sneezing,  nasal congestion or excess/ purulent secretions, ear ache,   fever, chills, sweats, unintended wt loss, classically pleuritic or exertional cp,  orthopnea pnd or leg swelling, presyncope, palpitations, abdominal pain, anorexia, nausea, vomiting, diarrhea  or change in bowel or bladder habits, change in stools or urine, dysuria,hematuria,  rash, arthralgias, visual complaints, headache, numbness, weakness or ataxia or problems with walking or coordination,  change in mood/affect or memory.               Physical  Exam:    amb wm nad  Wt Readings from Last 3 Encounters:  05/11/17 193 lb 9.6 oz (87.8 kg)  04/01/17 195 lb (88.5 kg)  12/12/16 192 lb 3.2 oz (87.2 kg)    Vital signs reviewed - Note on arrival 02 sats  99% on RA     HEENT: nl dentition,   and oropharynx. Nl external ear canals without cough reflex - very severe bilateral turbinate edema to point of complete obst bilaterally / non-specific mucosal changes    NECK :  without JVD/Nodes/TM/ nl carotid upstrokes bilaterally   LUNGS: no acc muscle use,  slt barrel contour chest which is clear to A and P bilaterally without cough on insp or exp maneuvers   CV:  RRR  no s3 or murmur or increase in P2, and no edema   ABD:  soft and nontender with nl inspiratory excursion in the supine position. No bruits or organomegaly appreciated, bowel sounds nl  MS:  Nl gait/ ext warm without deformities, calf tenderness, cyanosis or clubbing No obvious joint restrictions   SKIN: warm and dry without lesions    NEURO:  alert, approp, nl sensorium with  no motor or cerebellar deficits apparent.     Data Reviewed: Surgical pathology 09/17/16- lung parenchyma with bullae and lung fibrosis,  bronchiolitis obliterans organizing pneumonia.  CT chest without contrast 09/16/16- Severe bullous emphysema. Pneumothrax, B/L LL consolidations R > L.  High-resolution CT of the chest  11/25/16-  no evidence of interstitial lung disease, postsurgical scarring. Several subcentimeter scattered pulmonary lymph nodes. Moderate-severe emphysema. All images personally reviewed  PFTs 12/10/16 FVC 2.90 (76%) FEV1 2.55 [66%] F/F 65 TLC 81% DLCO 44% Moderate obstructive airway disease, severe diffusion defect.  Immune serologies 10/06/16 ANA positive, ENA RNP positive  A1 AT 12/12/16- 147, PIMM phenotype.

## 2017-05-12 DIAGNOSIS — J310 Chronic rhinitis: Secondary | ICD-10-CM | POA: Insufficient documentation

## 2017-05-12 NOTE — Assessment & Plan Note (Addendum)
Well compensated on BREO one click each am   Reviewed optimal use of inhaler devices, both elipta and nasal, in detail - see avs for instructions unique to this ov

## 2017-05-12 NOTE — Assessment & Plan Note (Signed)
I reviewed both graphic and text formatted material regarding the diagnosis and management of rhinitis including how to use topical steroids effectively and why it is necessary to treat nasal obstruction and inflammation and infection all at  the same time to eradicate the cycle of inflammation causing obstruction causing an infection causing inflammation and so forth and so on....   rec pred x 6 days/ augmentin and approp use of afrin /flonase  If not better needs sinus ct next  I had an extended discussion with the patient reviewing all relevant studies completed to date and  lasting 15 to 20 minutes of a 25 minute acute visit    Each maintenance medication was reviewed in detail including most importantly the difference between maintenance and prns and under what circumstances the prns are to be triggered using an action plan format that is not reflected in the computer generated alphabetically organized AVS.    Please see AVS for specific instructions unique to this visit that I personally wrote and verbalized to the the pt in detail and then reviewed with pt  by my nurse highlighting any  changes in therapy recommended at today's visit to their plan of care.

## 2017-05-15 ENCOUNTER — Telehealth: Payer: Self-pay | Admitting: Internal Medicine

## 2017-05-15 NOTE — Telephone Encounter (Signed)
Notes recorded by Chilton GreathouseMannam, Praveen, MD on 05/15/2017 at 7:24 AM EDT Please let the patient know that that CT shows the lung nodules are stable. We will repeat CT without contrast in 1 year   Called and spoke with pt and he is aware of results per PM.

## 2017-05-15 NOTE — Progress Notes (Signed)
Left message for patient to contact office.

## 2017-05-23 DIAGNOSIS — J449 Chronic obstructive pulmonary disease, unspecified: Secondary | ICD-10-CM | POA: Diagnosis not present

## 2017-06-23 DIAGNOSIS — J449 Chronic obstructive pulmonary disease, unspecified: Secondary | ICD-10-CM | POA: Diagnosis not present

## 2017-07-20 ENCOUNTER — Other Ambulatory Visit: Payer: Self-pay | Admitting: Pulmonary Disease

## 2017-07-24 DIAGNOSIS — J449 Chronic obstructive pulmonary disease, unspecified: Secondary | ICD-10-CM | POA: Diagnosis not present

## 2017-08-03 ENCOUNTER — Encounter: Payer: Self-pay | Admitting: Emergency Medicine

## 2017-08-03 ENCOUNTER — Ambulatory Visit (INDEPENDENT_AMBULATORY_CARE_PROVIDER_SITE_OTHER)
Admission: RE | Admit: 2017-08-03 | Discharge: 2017-08-03 | Disposition: A | Discharge status: Home / Self Care | Payer: 59 | Source: Ambulatory Visit | Attending: Emergency Medicine | Admitting: Emergency Medicine

## 2017-08-03 ENCOUNTER — Ambulatory Visit (INDEPENDENT_AMBULATORY_CARE_PROVIDER_SITE_OTHER): Payer: 59 | Admitting: Emergency Medicine

## 2017-08-03 DIAGNOSIS — R05 Cough: Secondary | ICD-10-CM | POA: Diagnosis not present

## 2017-08-03 DIAGNOSIS — J209 Acute bronchitis, unspecified: Secondary | ICD-10-CM | POA: Insufficient documentation

## 2017-08-03 DIAGNOSIS — J449 Chronic obstructive pulmonary disease, unspecified: Secondary | ICD-10-CM

## 2017-08-03 MED ORDER — DOXYCYCLINE HYCLATE 100 MG PO TABS: 100.0000 mg | ORAL_TABLET | Freq: Two times a day (BID) | ORAL | 0 refills | Status: DC

## 2017-08-03 NOTE — Progress Notes (Signed)
Subjective:    Patient ID: Jay Mitchell, male    DOB: [birth year], 61 y.o.   MRN: [id number]  HPI 61 year old man with a history of tobacco use, COPD, spontaneous pneumothorax presumedly due to ruptured bleb. He underwent a left VATS blebectomy, pleural abrasion. His biopsy showed evidence for organizing pneumonia.   He presents today for an acute evaluation. He tells me that he is experiencing some L chest / pectoral discomfort that started after he stretched / pulled his arm while working in the attic. Also exposed to dust at that time. He is having progressive cough, sweats at night. Productive of yellow / rust color. No real wheeze. He has a burning in his mouth, has been poff/on since he has been on breo. Spiriva has been stopped.    Review of Systems   Past Medical History:  Diagnosis Date  . COPD (chronic obstructive pulmonary disease) (HCC)   . GERD (gastroesophageal reflux disease)   . Spontaneous pneumothorax 09/15/2016   left     No family history on file.   Social History   Social History  . Marital status: Married    Spouse name: N/A  . Number of children: N/A  . Years of education: N/A   Occupational History  . Not on file.   Social History Main Topics  . Smoking status: Former Smoker    Packs/day: 1.50    Years: 25.00    Types: Cigarettes    Quit date: 2004  . Smokeless tobacco: Never Used  . Alcohol use No  . Drug use: No  . Sexual activity: Not on file   Other Topics Concern  . Not on file   Social History Narrative  . No narrative on file     Allergies  Allergen Reactions  . Meperidine Hcl Nausea Only     Outpatient Medications Prior to Visit  Medication Sig Dispense Refill  . albuterol (PROVENTIL HFA;VENTOLIN HFA) 108 (90 Base) MCG/ACT inhaler Inhale 2 puffs into the lungs every 6 (six) hours as needed for wheezing or shortness of breath. 1 Inhaler 2  . BREO ELLIPTA 100-25 MCG/INH AEPB INHALE 1 PUFF INTO LUNGS ONCE DAILY 60 each 3  .  cetirizine (ZYRTEC) 10 MG tablet Take 10 mg by mouth daily.    . diphenhydramine-acetaminophen (TYLENOL PM) 25-500 MG TABS tablet Take 1 tablet by mouth at bedtime as needed.    Marland Kitchen esomeprazole (NEXIUM) 20 MG capsule Take 20 mg by mouth daily.     . fluticasone (FLONASE) 50 MCG/ACT nasal spray Place 1-2 sprays into both nostrils 2 (two) times daily. 16 g 2  . Multiple Vitamins-Minerals (MULTIVITAMIN PO) Take 1 tablet by mouth daily.    . naproxen sodium (ANAPROX) 220 MG tablet Take 220 mg by mouth at bedtime.    . OXYGEN 2lpm with sleep only    . phenylephrine (SUDAFED PE) 10 MG TABS tablet Take 10 mg by mouth every 4 (four) hours as needed.    . predniSONE (DELTASONE) 10 MG tablet Take  4 each am x 2 days,   2 each am x 2 days,  1 each am x 2 days and stop 14 tablet 0  . Tiotropium Bromide Monohydrate (SPIRIVA RESPIMAT) 2.5 MCG/ACT AERS Inhale 2 puffs into the lungs daily. 1 Inhaler 0   No facility-administered medications prior to visit.         Objective:   Physical Exam Vitals:   08/03/17 1513  BP: 130/72  Pulse: (!) 125  SpO2: 95%  Weight: 191 lb 12.8 oz (87 kg)  Height:  (1.854 m)   Gen: Pleasant, well-nourished, in no distress,  normal affect  ENT: No lesions,  mouth clear,  oropharynx clear, no postnasal drip  Neck: No JVD, no stridor  Lungs: No use of accessory muscles, clear without rales or rhonchi  Cardiovascular: RRR, heart sounds normal, no murmur or gallops, no peripheral edema  Musculoskeletal: No deformities, no cyanosis or clubbing  Neuro: alert, non focal  Skin: Warm, no lesions or rashes       Assessment & Plan:  Acute bronchitis  Without any wheezing on exam. I believe he needs to be treated with an antibiotic, I will defer corticosteroids at this time..  Check chest x-ray to eval for PNA and to r/o recurrent pleural disease.     COPD GOLD II  Currently on breo but c/o intermittent mouth burning. No evidence thrush on exam. Will continue  for now, low threshold toi change to alternative BD if persists.   Levy Pupa, MD, PhD 08/03/2017, 3:53 PM [facility] [phone] or if no answer [phone]

## 2017-08-03 NOTE — Addendum Note (Signed)
Addended by: Maxwell Marion A on: 08/03/2017 03:54 PM   Modules accepted: Orders

## 2017-08-03 NOTE — Patient Instructions (Addendum)
We will perform a CXR today.  Please take doxycycline  twice a day for 7 days.  Continue Breo for now.  If you continue to have side effects from this medication we will consider changing to Spirva at your next visit.     Follow with Dr Isaiah Serge or APP in 2 weeks.

## 2017-08-03 NOTE — Assessment & Plan Note (Signed)
Currently on breo but c/o intermittent mouth burning. No evidence thrush on exam. Will continue for now, low threshold toi change to alternative BD if persists.

## 2017-08-03 NOTE — Addendum Note (Signed)
Addended by: Maxwell Marion A on: 08/03/2017 03:57 PM   Modules accepted: Orders

## 2017-08-03 NOTE — Assessment & Plan Note (Signed)
Without any wheezing on exam. I believe he needs to be treated with an antibiotic, I will defer corticosteroids at this time..  Check chest x-ray to eval for PNA and to r/o recurrent pleural disease.

## 2017-08-06 ENCOUNTER — Telehealth: Payer: Self-pay | Admitting: Emergency Medicine

## 2017-08-06 MED ORDER — AZITHROMYCIN 250 MG PO TABS: ORAL_TABLET | ORAL | 0 refills | Status: DC

## 2017-08-06 NOTE — Telephone Encounter (Signed)
Spoke with pt, aware of recs.  rx sent to preferred pharmacy.  Nothing further needed.  

## 2017-08-06 NOTE — Telephone Encounter (Signed)
Please have him stop the doxycycline. Start azithromycin, Z-Pak.  Call us if his symptoms are not improving

## 2017-08-06 NOTE — Telephone Encounter (Signed)
Med patient is taking is doxycycline.

## 2017-08-06 NOTE — Telephone Encounter (Signed)
Spoke with pt, who states he started Doxycycline on Monday as prescribed by RB. Pt states he felt nauseated the three dose. Pt developed vomiting yesterday. Pt is unsure if he has taken this medication previously. Last last was last night.   RB please advise. Thanks.    Allergies  Allergen Reactions  . Meperidine Hcl Nausea Only

## 2017-08-10 ENCOUNTER — Encounter: Payer: Self-pay | Admitting: Emergency Medicine

## 2017-08-12 MED ORDER — LEVOFLOXACIN 500 MG PO TABS: 500.0000 mg | ORAL_TABLET | Freq: Every day | ORAL | 0 refills | Status: DC

## 2017-08-12 NOTE — Telephone Encounter (Signed)
Please let the patient know that for completeness (and since he couldn't take the doxycycline) I would like for him to take levaquin  qd x 7 days (in addition to the azithro he finished).   Also let him know that we will discuss the timing of a follow up CXR or CT scan at his appointment 10/8. We need to insure that the changes in his L lung clear with our treatment. This is especially important since he has a history of organizing pneumonia (a non-infectious process) on his prior biopsies from 09/2016. We can review more at our office visit.

## 2017-08-17 ENCOUNTER — Ambulatory Visit (INDEPENDENT_AMBULATORY_CARE_PROVIDER_SITE_OTHER)
Admission: RE | Admit: 2017-08-17 | Discharge: 2017-08-17 | Disposition: A | Discharge status: Home / Self Care | Payer: 59 | Source: Ambulatory Visit | Attending: Adult Health | Admitting: Adult Health

## 2017-08-17 ENCOUNTER — Encounter: Payer: Self-pay | Admitting: Adult Health

## 2017-08-17 ENCOUNTER — Ambulatory Visit (INDEPENDENT_AMBULATORY_CARE_PROVIDER_SITE_OTHER): Payer: 59 | Admitting: Adult Health

## 2017-08-17 VITALS — BP 112/64 | HR 95 | Ht 72.5 in | Wt 190.6 lb

## 2017-08-17 DIAGNOSIS — J449 Chronic obstructive pulmonary disease, unspecified: Secondary | ICD-10-CM

## 2017-08-17 DIAGNOSIS — J181 Lobar pneumonia, unspecified organism: Secondary | ICD-10-CM | POA: Diagnosis not present

## 2017-08-17 DIAGNOSIS — J189 Pneumonia, unspecified organism: Secondary | ICD-10-CM | POA: Diagnosis not present

## 2017-08-17 NOTE — Assessment & Plan Note (Signed)
Left sided PNA - clinically improving . CXR shows improvement .  Will have him finish abx and follow up in 3 weeks with cxr   Plan  Patient Instructions  Finish Levaquin.  Mucinex DM Twice daily  As needed  Cough/congestion .  Advance activity as tolerated.  Follow up with Dr. Isaiah Serge in 3-4 weeks with chest xray and As needed   Please contact office for sooner follow up if symptoms do not improve or worsen or seek emergency care

## 2017-08-17 NOTE — Progress Notes (Signed)
  ID: Jay Mitchell, male    DOB: 05/16/1956, 61 y.o.   MRN: [id number]  Chief Complaint  Patient presents with  . Follow-up    PNA     Referring provider: Corwin Levins, MD  HPI: 61 year old man with a history of tobacco use, COPD, spontaneous pneumothorax presumedly due to ruptured bleb. He underwent a left VATS blebectomy 09/2016 , pleural abrasion. His biopsy showed evidence for organizing pneumonia.    08/17/2017 Follow Up : COPD exacerbation /PNA  Pt returns for 2 week follow up for COPD flare and PNA . He was seen last visit with cough and pleuritic pain . He was started on Doxycycline but could not tolerate. Change to Levaquin and Azithromycin. He is feeling better with decreased Cough and congestion . Mucus is now clear. Has couple days left of levaquin .  CXR today shows decreased left sided density . He has improved appetite. No n/v.d.     Allergies  Allergen Reactions  . Doxycycline Nausea And Vomiting  . Meperidine Hcl Nausea Only    Immunization History  Administered Date(s) Administered  . Influenza Split 09/05/2016    Past Medical History:  Diagnosis Date  . COPD (chronic obstructive pulmonary disease) (HCC)   . GERD (gastroesophageal reflux disease)   . Spontaneous pneumothorax 09/15/2016   left    Tobacco History: History  Smoking Status  . Former Smoker  . Packs/day: 1.50  . Years: 25.00  . Types: Cigarettes  . Quit date: 2004  Smokeless Tobacco  . Never Used   Counseling given: Not Answered   Outpatient Encounter Prescriptions as of 08/17/2017  Medication Sig  . albuterol (PROVENTIL HFA;VENTOLIN HFA) 108 (90 Base) MCG/ACT inhaler Inhale 2 puffs into the lungs every 6 (six) hours as needed for wheezing or shortness of breath.  Marland Kitchen azithromycin (ZITHROMAX) 250 MG tablet Take 2 today, then 1 daily until gone.  Marland Kitchen BREO ELLIPTA 100-25 MCG/INH AEPB INHALE 1 PUFF INTO LUNGS ONCE DAILY  . cetirizine (ZYRTEC) 10 MG tablet Take 10 mg by mouth  daily.  . diphenhydramine-acetaminophen (TYLENOL PM) 25-500 MG TABS tablet Take 1 tablet by mouth at bedtime as needed.  Marland Kitchen esomeprazole (NEXIUM) 20 MG capsule Take 20 mg by mouth daily.   . fluticasone (FLONASE) 50 MCG/ACT nasal spray Place 1-2 sprays into both nostrils 2 (two) times daily.  Marland Kitchen levofloxacin (LEVAQUIN) 500 MG tablet Take 1 tablet (500 mg total) by mouth daily.  . Multiple Vitamins-Minerals (MULTIVITAMIN PO) Take 1 tablet by mouth daily.  . naproxen sodium (ANAPROX) 220 MG tablet Take 220 mg by mouth at bedtime.  . OXYGEN 2lpm with sleep only  . phenylephrine (SUDAFED PE) 10 MG TABS tablet Take 10 mg by mouth every 4 (four) hours as needed.  . Tiotropium Bromide Monohydrate (SPIRIVA RESPIMAT) 2.5 MCG/ACT AERS Inhale 2 puffs into the lungs daily.   No facility-administered encounter medications on file as of 08/17/2017.      Review of Systems  Constitutional:   No  weight loss, night sweats,  Fevers, chills, fatigue, or  lassitude.  HEENT:   No headaches,  Difficulty swallowing,  Tooth/dental problems, or  Sore throat,                No sneezing, itching, ear ache, nasal congestion, post nasal drip,   CV:  No chest pain,  Orthopnea, PND, swelling in lower extremities, anasarca, dizziness, palpitations, syncope.   GI  No heartburn, indigestion, abdominal pain, nausea, vomiting, diarrhea, change  in bowel habits, loss of appetite, bloody stools.   Resp:    No chest wall deformity  Skin: no rash or lesions.  GU: no dysuria, change in color of urine, no urgency or frequency.  No flank pain, no hematuria   MS:  No joint pain or swelling.  No decreased range of motion.  No back pain.    Physical Exam  BP 112/64 (BP Location: Left Arm, Cuff Size: Normal)   Pulse 95   Ht 6' 0.5" (1.842 m)   Wt 190 lb 9.6 oz (86.5 kg)   SpO2 96%   BMI 25.49 kg/m   GEN: A/Ox3; pleasant , NAD    HEENT:  [place]/AT,  EACs-clear, TMs-wnl, NOSE-clear, THROAT-clear, no lesions, no postnasal  drip or exudate noted.   NECK:  Supple w/ fair ROM; no JVD; normal carotid impulses w/o bruits; no thyromegaly or nodules palpated; no lymphadenopathy.    RESP  Clear  P & A; w/o, wheezes/ rales/ or rhonchi. no accessory muscle use, no dullness to percussion  CARD:  RRR, no m/r/g, no peripheral edema, pulses intact, no cyanosis or clubbing.  GI:   Soft & nt; nml bowel sounds; no organomegaly or masses detected.   Musco: Warm bil, no deformities or joint swelling noted.   Neuro: alert, no focal deficits noted.    Skin: Warm, no lesions or rashes    Lab Results:  CBC  BMET  BNP No results found for: BNP  ProBNP No results found for: PROBNP  Imaging: Dg Chest 2 View  Result Date: 08/17/2017 CLINICAL DATA:  Follow-up pneumonia EXAM: CHEST  2 VIEW COMPARISON:  08/03/2017 FINDINGS: Diffuse left lung airspace disease again noted, somewhat improved since prior study. Underline COPD. No confluent opacity on the right. Heart is normal size. No effusions or acute bony abnormality. IMPRESSION: COPD. Diffuse left lung airspace disease improving since prior study. Recommend continued follow-up. Electronically Signed   By: Charlett Nose M.D.   On: 08/17/2017 15:03   Dg Chest 2 View  Result Date: 08/04/2017 CLINICAL DATA:  Cough and congestion. Night sweats. Prior left lower lung surgery. COPD . EXAM: CHEST  2 VIEW COMPARISON:  CT 05/08/2017.  Chest x-ray 05/07/2016. FINDINGS: Mediastinum is stable. Heart size stable. Surgical sutures left lung. COPD . Diffuse left lung infiltrate noted consistent with pneumonia. Bilateral pleural-parenchymal thickening again noted consistent with scarring . IMPRESSION: 1.  Diffuse left lung infiltrate consistent with pneumonia. 2. Postsurgical changes left lung. COPD. Pleural-parenchymal scarring . Electronically Signed   By: Maisie Fus  Register   On: 08/04/2017 08:17     Assessment & Plan:   PNA (pneumonia) Left sided PNA - clinically improving . CXR shows  improvement .  Will have him finish abx and follow up in 3 weeks with cxr   Plan  Patient Instructions  Finish Levaquin.  Mucinex DM Twice daily  As needed  Cough/congestion .  Advance activity as tolerated.  Follow up with Dr. Isaiah Serge in 3-4 weeks with chest xray and As needed   Please contact office for sooner follow up if symptoms do not improve or worsen or seek emergency care      COPD GOLD II  Stable without flare   Plan  Patient Instructions  Finish Levaquin.  Mucinex DM Twice daily  As needed  Cough/congestion .  Advance activity as tolerated.  Follow up with Dr. Isaiah Serge in 3-4 weeks with chest xray and As needed   Please contact office for sooner follow up if  symptoms do not improve or worsen or seek emergency care         Rubye Oaks, NP 08/17/2017

## 2017-08-17 NOTE — Assessment & Plan Note (Signed)
Stable without flare   Plan  Patient Instructions  Finish Levaquin.  Mucinex DM Twice daily  As needed  Cough/congestion .  Advance activity as tolerated.  Follow up with Dr. Isaiah Serge in 3-4 weeks with chest xray and As needed   Please contact office for sooner follow up if symptoms do not improve or worsen or seek emergency care

## 2017-08-17 NOTE — Patient Instructions (Addendum)
Finish Levaquin.  Mucinex DM Twice daily  As needed  Cough/congestion .  Advance activity as tolerated.  Follow up with Dr. Isaiah Serge in 3-4 weeks with chest xray and As needed   Please contact office for sooner follow up if symptoms do not improve or worsen or seek emergency care

## 2017-08-19 ENCOUNTER — Other Ambulatory Visit: Payer: Self-pay | Admitting: Internal Medicine

## 2017-08-19 NOTE — Addendum Note (Signed)
Addended by: Boone Master E on: 08/19/2017 12:31 PM   Modules accepted: Orders

## 2017-08-23 DIAGNOSIS — J449 Chronic obstructive pulmonary disease, unspecified: Secondary | ICD-10-CM | POA: Diagnosis not present

## 2017-09-15 ENCOUNTER — Other Ambulatory Visit: Payer: Self-pay | Admitting: Pulmonary Disease

## 2017-09-15 DIAGNOSIS — J189 Pneumonia, unspecified organism: Secondary | ICD-10-CM

## 2017-09-15 DIAGNOSIS — J181 Lobar pneumonia, unspecified organism: Principal | ICD-10-CM

## 2017-09-16 ENCOUNTER — Ambulatory Visit (INDEPENDENT_AMBULATORY_CARE_PROVIDER_SITE_OTHER)
Admission: RE | Admit: 2017-09-16 | Discharge: 2017-09-16 | Disposition: A | Discharge status: Home / Self Care | Payer: 59 | Source: Ambulatory Visit | Attending: Pulmonary Disease | Admitting: Pulmonary Disease

## 2017-09-16 ENCOUNTER — Other Ambulatory Visit (INDEPENDENT_AMBULATORY_CARE_PROVIDER_SITE_OTHER): Payer: 59

## 2017-09-16 ENCOUNTER — Encounter: Payer: Self-pay | Admitting: Pulmonary Disease

## 2017-09-16 ENCOUNTER — Ambulatory Visit: Payer: 59 | Admitting: Pulmonary Disease

## 2017-09-16 VITALS — BP 126/76 | HR 106 | Ht 72.5 in | Wt 192.0 lb

## 2017-09-16 DIAGNOSIS — Z23 Encounter for immunization: Secondary | ICD-10-CM

## 2017-09-16 DIAGNOSIS — J449 Chronic obstructive pulmonary disease, unspecified: Secondary | ICD-10-CM

## 2017-09-16 DIAGNOSIS — J189 Pneumonia, unspecified organism: Secondary | ICD-10-CM | POA: Diagnosis not present

## 2017-09-16 DIAGNOSIS — J181 Lobar pneumonia, unspecified organism: Secondary | ICD-10-CM

## 2017-09-16 LAB — CBC WITH DIFFERENTIAL/PLATELET
BASOS PCT: 0.8 % (ref 0.0–3.0)
Basophils Absolute: 0.1 10*3/uL (ref 0.0–0.1)
EOS PCT: 0.9 % (ref 0.0–5.0)
Eosinophils Absolute: 0.1 10*3/uL (ref 0.0–0.7)
HCT: 44.9 % (ref 39.0–52.0)
HEMOGLOBIN: 15.1 g/dL (ref 13.0–17.0)
LYMPHS ABS: 2.1 10*3/uL (ref 0.7–4.0)
Lymphocytes Relative: 21.1 % (ref 12.0–46.0)
MCHC: 33.6 g/dL (ref 30.0–36.0)
MCV: 89.7 fl (ref 78.0–100.0)
MONO ABS: 0.9 10*3/uL (ref 0.1–1.0)
MONOS PCT: 8.8 % (ref 3.0–12.0)
Neutro Abs: 6.7 10*3/uL (ref 1.4–7.7)
Neutrophils Relative %: 68.4 % (ref 43.0–77.0)
Platelets: 354 10*3/uL (ref 150.0–400.0)
RBC: 5 Mil/uL (ref 4.22–5.81)
RDW: 14.7 % (ref 11.5–15.5)
WBC: 9.8 10*3/uL (ref 4.0–10.5)

## 2017-09-16 MED ORDER — PNEUMOCOCCAL VAC POLYVALENT 25 MCG/0.5ML IJ INJ
0.5000 mL | INJECTION | Freq: Once | INTRAMUSCULAR | Status: AC
  Administered 2017-09-16: 0.5 mL via INTRAMUSCULAR

## 2017-09-16 MED ORDER — UMECLIDINIUM-VILANTEROL 62.5-25 MCG/INH IN AEPB: 1.0000 | INHALATION_SPRAY | Freq: Every day | RESPIRATORY_TRACT | 3 refills | Status: DC

## 2017-09-16 NOTE — Patient Instructions (Addendum)
We will check a CBC with differential today We will give you samples of Anoro and send in a prescription.  Start using this after you finish the breo inhaler We will give you a flu vaccine and Pneumovax 23 vaccination today. Follow-up in 1 month with x-ray

## 2017-09-16 NOTE — Progress Notes (Signed)
Marlene LardJoseph T Reddy    [id number]    07/19/1956  Primary Care Physician:John, Len BlalockJames W, MD  Referring Physician: Corwin LevinsJohn, James W, MD [address] [place], [place] [id number]  Chief complaint:  Follow-up for  Spontaneous pneumothorax COPD BOOP> resolved  HPI: Patient is a 61 year old male with reported history of COPD, remote history of tobacco use, and GE reflux disease who was hospitalized in early  nov 2017 when he developed sudden onset of left-sided chest pain and shortness of breath. At the time he was [place] in the [place] for business where he was attending a conference. He was evaluated in the emergency department there and found to have a large left pneumothorax with complete collapse of the left lung, due to ruptured bleb. A small bore chest tube was placed but the lung only partially reexpanded. Subsequently the initial chest tube was removed and the larger chest tube placed by a surgeon. The patient was noted to have persistent air leak, and at the patient's request [facility] was [facility]  on [facility]16[facility]16  for further management. Cardiothoracic surgery was consulted for evaluation of left sided pneumothorax and subsequently underwent VATS with multiple blebectomy, apical pleurectomy, and mechanical pleural abrasion on 11/8. Chest tube was removed 11/12. He was still requiring increased O2 (3L)and was discharged to home on O2. He is a former smoker who quit in 2004. 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 He does not report any exposures at work or at home.  Interim history: He has a COPD flare and left-sided pneumonia in October.  Initially treated with doxycycline which he did not tolerate and completed a course of azithromycin and Levaquin with improvement in symptoms.  In clinic today he has chronic dyspnea on exertion.  Denies any cough, sputum production, fevers, chills.  Outpatient Encounter Medications as of  09/16/2017  Medication Sig  . albuterol (PROVENTIL HFA;VENTOLIN HFA) 108 (90 Base) MCG/ACT inhaler Inhale 2 puffs into the lungs every 6 (six) hours as needed for wheezing or shortness of breath.  Marland Kitchen. BREO ELLIPTA 100-25 MCG/INH AEPB INHALE 1 PUFF INTO LUNGS ONCE DAILY  . cetirizine (ZYRTEC) 10 MG tablet Take 10 mg by mouth daily.  . diphenhydramine-acetaminophen (TYLENOL PM) 25-500 MG TABS tablet Take 1 tablet by mouth at bedtime as needed.  Marland Kitchen. esomeprazole (NEXIUM) 20 MG capsule Take 20 mg by mouth daily.   . fluticasone (FLONASE) 50 MCG/ACT nasal spray PLACE 1-2 SPRAYS INTO BOTH NOSTRILS 2 (TWO) TIMES DAILY.  . Multiple Vitamins-Minerals (MULTIVITAMIN PO) Take 1 tablet by mouth daily.  . naproxen sodium (ANAPROX) 220 MG tablet Take 220 mg by mouth at bedtime.  . OXYGEN 2lpm with sleep only  . phenylephrine (SUDAFED PE) 10 MG TABS tablet Take 10 mg by mouth every 4 (four) hours as needed.  . Tiotropium Bromide Monohydrate (SPIRIVA RESPIMAT) 2.5 MCG/ACT AERS Inhale 2 puffs into the lungs daily.  . [DISCONTINUED] azithromycin (ZITHROMAX) 250 MG tablet Take 2 today, then 1 daily until gone.  . [DISCONTINUED] levofloxacin (LEVAQUIN) 500 MG tablet Take 1 tablet (500 mg total) by mouth daily.   No facility-administered encounter medications on file as of 09/16/2017.     Allergies as of 09/16/2017 - Review Complete 09/16/2017  Allergen Reaction Noted  . Doxycycline Nausea And Vomiting 08/17/2017  . Meperidine hcl Nausea Only     Past Medical History:  Diagnosis Date  .  COPD (chronic obstructive pulmonary disease) (HCC)   . GERD (gastroesophageal reflux disease)   . Spontaneous pneumothorax 09/15/2016   left    No past surgical history on file.  No family history on file.  Social History   Socioeconomic History  . Marital status: Married    Spouse name: Not on file  . Number of children: Not on file  . Years of education: Not on file  . Highest education level: Not on file  Social  Needs  . Financial resource strain: Not on file  . Food insecurity - worry: Not on file  . Food insecurity - inability: Not on file  . Transportation needs - medical: Not on file  . Transportation needs - non-medical: Not on file  Occupational History  . Not on file  Tobacco Use  . Smoking status: Former Smoker    Packs/day: 1.50    Years: 25.00    Pack years: 37.50    Types: Cigarettes    Last attempt to quit: 2004    Years since quitting: 14.8  . Smokeless tobacco: Never Used  Substance and Sexual Activity  . Alcohol use: No  . Drug use: No  . Sexual activity: Not on file  Other Topics Concern  . Not on file  Social History Narrative  . Not on file   Review of systems: Review of Systems  Constitutional: Negative for fever and chills.  HENT: Negative.   Eyes: Negative for blurred vision.  Respiratory: as per HPI  Cardiovascular: Negative for chest pain and palpitations.  Gastrointestinal: Negative for vomiting, diarrhea, blood per rectum. Genitourinary: Negative for dysuria, urgency, frequency and hematuria.  Musculoskeletal: Negative for myalgias, back pain and joint pain.  Skin: Negative for itching and rash.  Neurological: Negative for dizziness, tremors, focal weakness, seizures and loss of consciousness.  Endo/Heme/Allergies: Negative for environmental allergies.  Psychiatric/Behavioral: Negative for depression, suicidal ideas and hallucinations.  All other systems reviewed and are negative.  Physical Exam: Blood pressure 132/90, pulse 88, height 6' (1.829 m), weight 195 lb (88.5 kg), SpO2 97 %. Gen:      No acute distress HEENT:  EOMI, sclera anicteric Neck:     No masses; no thyromegaly Lungs:    Clear to auscultation bilaterally; normal respiratory effort CV:         Regular rate and rhythm; no murmurs Abd:      + bowel sounds; soft, non-tender; no palpable masses, no distension Ext:    No edema; adequate peripheral perfusion Skin:      Warm and dry; no  rash Neuro: alert and oriented x 3 Psych: normal mood and affect  Data Reviewed: Surgical pathology 09/17/16- lung parenchyma with bullae and lung fibrosis,  bronchiolitis obliterans organizing pneumonia.   CT chest without contrast 09/16/16- Severe bullous emphysema. Pneumothrax, B/L LL consolidations R > L.  High-resolution CT of the chest 11/25/16-  no evidence of interstitial lung disease, postsurgical scarring. Several subcentimeter scattered pulmonary lymph nodes. Moderate-severe emphysema. Chest x-ray 08/03/17-left lung infiltrate with multilobar pneumonia, postsurgical changes in the left lung Chest x-ray 08/27/17-improving left lung infiltrate Chest x-ray 09/16/17- stable left lung apical opacity All images personally reviewed  PFTs 12/10/16 FVC 2.90 (76%), FEV1 2.55 [66%], F/F 65, TLC 81%, DLCO 44% Moderate obstructive airway disease, severe diffusion defect.  Immune serologies 10/06/16 ANA positive, ENA RNP positive  A1 AT 12/12/16- 147, PIMM phenotype. CBC 09/16/17-absolute eosinophil count 88  Assessment:  Severe bullous emphysema COPD Ex smoker He has had a  recent COPD exacerbation with left pneumonia requiring multiple rounds of antibiotic.  His x-ray shows improvement but still has a left apical opacity which may be scarring from his prior surgery.  We will continue to follow this in 1 month's time with a repeat chest x-ray.  He is on Breo.  He does not need to be on inhaled corticosteroid as his peripheral eosinophil count is low.  We should avoid inhaled steroids given his recent pneumonia.  I will change inhalers to Anoro. Give vaccines for flu and pneumovax 23 today  Spontaneous tension pneumothorax s/p VATS, blebectomy, apical pleurectomy, mechanical pleural abrasion- Nov 2017 BOOP and fibrosis on lung pathology > resolved His lung pathology shows Boop and fibrosis. I think this is secondary to the acute inflammation, chest tube placement during his admission. A  follow-up CT scan has shown resolution of any interstitial opacities. An autoimmune workup shows elevation in ANA and ENA which is likely nonspecific.  Lung nodules Subcentimeter pulmonary nodules noted on last CT scan. We'll follow up with a 6 month CT scan.  Needs to be ordered for December 2018.  Plan/Recommendations: - Stop Breo. Start anoro - Flu, pneumovax 23 vaccine - Follow CT scan.  Chilton GreathousePraveen Taite Schoeppner MD [facility] Pager [phone] 09/16/2017, 3:22 PM  CC: Corwin LevinsJohn, James W, MD

## 2017-09-23 DIAGNOSIS — J449 Chronic obstructive pulmonary disease, unspecified: Secondary | ICD-10-CM | POA: Diagnosis not present

## 2017-10-16 ENCOUNTER — Encounter: Payer: Self-pay | Admitting: Pulmonary Disease

## 2017-10-16 ENCOUNTER — Ambulatory Visit (INDEPENDENT_AMBULATORY_CARE_PROVIDER_SITE_OTHER)
Admission: RE | Admit: 2017-10-16 | Discharge: 2017-10-16 | Disposition: A | Discharge status: Home / Self Care | Payer: 59 | Source: Ambulatory Visit | Attending: Pulmonary Disease | Admitting: Pulmonary Disease

## 2017-10-16 ENCOUNTER — Ambulatory Visit: Payer: 59 | Admitting: Pulmonary Disease

## 2017-10-16 DIAGNOSIS — J189 Pneumonia, unspecified organism: Secondary | ICD-10-CM | POA: Diagnosis not present

## 2017-10-16 DIAGNOSIS — J449 Chronic obstructive pulmonary disease, unspecified: Secondary | ICD-10-CM | POA: Diagnosis not present

## 2017-10-16 DIAGNOSIS — R911 Solitary pulmonary nodule: Secondary | ICD-10-CM | POA: Diagnosis not present

## 2017-10-16 NOTE — Patient Instructions (Signed)
Continue using the anoro as prescribed We will follow-up with you in 6 months after CT scan without contrast Please call us if there is any change in her symptoms.

## 2017-10-16 NOTE — Progress Notes (Signed)
Jay Mitchell    [id number]    10/07/1956  Primary Care Physician:John, Len Blalock, MD  Referring Physician: Corwin Levins, MD [address] [place], [place] [address]  Chief complaint:  Follow-up for  Spontaneous pneumothorax COPD BOOP> resolved Reccurent pnuemonia  HPI: Patient is a 61 year old male with reported history of COPD, remote history of tobacco use, and GE reflux disease who was hospitalized in early  nov 2017 when he developed sudden onset of left-sided chest pain and shortness of breath. At the time he was traveling in the [place] of [place] for business where he was attending a conference. He was evaluated in the emergency department there and found to have a large left pneumothorax with complete collapse of the left lung, due to ruptured bleb. A small bore chest tube was placed but the lung only partially reexpanded. Subsequently the initial chest tube was removed and the larger chest tube placed by a surgeon. The patient was noted to have persistent air leak, and at the patient's request he was transferred to [facility] on 09/16/16  for further management. Cardiothoracic surgery was consulted for evaluation of left sided pneumothorax and subsequently underwent VATS with multiple blebectomy, apical pleurectomy, and mechanical pleural abrasion on 09/17/16. He is a former smoker who quit in 2004. 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  He has a COPD flare and left-sided pneumonia in October 2018.  Initially treated with doxycycline which he did not tolerate and completed a course of azithromycin and Levaquin with improvement in symptoms.    Interim history: Patient is here for follow-up of recent pneumonia.  Chest x-ray done today. He reports stable symptoms with chronic dyspnea on exertion.  Outpatient Encounter Medications as of 10/16/2017  Medication Sig  . albuterol  (PROVENTIL HFA;VENTOLIN HFA) 108 (90 Base) MCG/ACT inhaler Inhale 2 puffs into the lungs every 6 (six) hours as needed for wheezing or shortness of breath.  . cetirizine (ZYRTEC) 10 MG tablet Take 10 mg by mouth daily.  . diphenhydramine-acetaminophen (TYLENOL PM) 25-500 MG TABS tablet Take 1 tablet by mouth at bedtime as needed.  Marland Kitchen esomeprazole (NEXIUM) 20 MG capsule Take 20 mg by mouth daily.   . fluticasone (FLONASE) 50 MCG/ACT nasal spray PLACE 1-2 SPRAYS INTO BOTH NOSTRILS 2 (TWO) TIMES DAILY.  . Multiple Vitamins-Minerals (MULTIVITAMIN PO) Take 1 tablet by mouth daily.  . naproxen sodium (ANAPROX) 220 MG tablet Take 220 mg by mouth at bedtime.  . OXYGEN 2lpm with sleep only  . phenylephrine (SUDAFED PE) 10 MG TABS tablet Take 10 mg by mouth every 4 (four) hours as needed.  . umeclidinium-vilanterol (ANORO ELLIPTA) 62.5-25 MCG/INH AEPB Inhale 1 puff into the lungs daily.  . [DISCONTINUED] BREO ELLIPTA 100-25 MCG/INH AEPB INHALE 1 PUFF INTO LUNGS ONCE DAILY (Patient not taking: Reported on 10/16/2017)  . [DISCONTINUED] Tiotropium Bromide Monohydrate (SPIRIVA RESPIMAT) 2.5 MCG/ACT AERS Inhale 2 puffs into the lungs daily.   No facility-administered encounter medications on file as of 10/16/2017.     Allergies as of 10/16/2017 - Review Complete 10/16/2017  Allergen Reaction Noted  . Doxycycline Nausea And Vomiting 08/17/2017  . Meperidine hcl Nausea Only     Past Medical History:  Diagnosis Date  . COPD (chronic obstructive pulmonary disease) (HCC)   . GERD (gastroesophageal reflux disease)   . Spontaneous pneumothorax  09/15/2016   left    Past Surgical History:  Procedure Laterality Date  . VIDEO ASSISTED THORACOSCOPY Left 09/17/2016   Procedure: VIDEO ASSISTED THORACOSCOPY multiple/BLEBECTOMY, apical pleurectomy, mechanical pleural abrasion;  Surgeon: Loreli SlotSteven C Hendrickson, MD;  [facility];  Service: Thoracic;  Laterality: Left;    No family history on file.  Social History     Socioeconomic History  . Marital status: Married    Spouse name: Not on file  . Number of children: Not on file  . Years of education: Not on file  . Highest education level: Not on file  Social Needs  . Financial resource strain: Not on file  . Food insecurity - worry: Not on file  . Food insecurity - inability: Not on file  . Transportation needs - medical: Not on file  . Transportation needs - non-medical: Not on file  Occupational History  . Not on file  Tobacco Use  . Smoking status: Former Smoker    Packs/day: 1.50    Years: 25.00    Pack years: 37.50    Types: Cigarettes    Last attempt to quit: 2004    Years since quitting: 14.9  . Smokeless tobacco: Never Used  Substance and Sexual Activity  . Alcohol use: No  . Drug use: No  . Sexual activity: Not on file  Other Topics Concern  . Not on file  Social History Narrative  . Not on file   Review of systems: Review of Systems  Constitutional: Negative for fever and chills.  HENT: Negative.   Eyes: Negative for blurred vision.  Respiratory: as per HPI  Cardiovascular: Negative for chest pain and palpitations.  Gastrointestinal: Negative for vomiting, diarrhea, blood per rectum. Genitourinary: Negative for dysuria, urgency, frequency and hematuria.  Musculoskeletal: Negative for myalgias, back pain and joint pain.  Skin: Negative for itching and rash.  Neurological: Negative for dizziness, tremors, focal weakness, seizures and loss of consciousness.  Endo/Heme/Allergies: Negative for environmental allergies.  Psychiatric/Behavioral: Negative for depression, suicidal ideas and hallucinations.  All other systems reviewed and are negative.  Physical Exam: Blood pressure 128/64, pulse 75, height 6' 1.5" (1.867 m), weight 195 lb (88.5 kg), SpO2 98 %. Gen:      No acute distress HEENT:  EOMI, sclera anicteric Neck:     No masses; no thyromegaly Lungs:    Clear to auscultation bilaterally; normal respiratory  effort CV:         Regular rate and rhythm; no murmurs Abd:      + bowel sounds; soft, non-tender; no palpable masses, no distension Ext:    No edema; adequate peripheral perfusion Skin:      Warm and dry; no rash Neuro: alert and oriented x 3 Psych: normal mood and affect  Data Reviewed: Surgical pathology 09/17/16- lung parenchyma with bullae and lung fibrosis,  bronchiolitis obliterans organizing pneumonia.   CT chest without contrast 09/16/16- Severe bullous emphysema. Pneumothrax, B/L LL consolidations R > L.  High-resolution CT of the chest 11/25/16-  no evidence of interstitial lung disease, postsurgical scarring. Several subcentimeter scattered pulmonary lymph nodes. Moderate-severe emphysema. Chest x-ray 08/03/17-left lung infiltrate with multilobar pneumonia, postsurgical changes in the left lung Chest x-ray 08/27/17-improving left lung infiltrate Chest x-ray 09/16/17- stable left lung apical opacity Chest x-ray 10/16/17-clearing of left upper lobe infiltrate.  Stable scarring, postoperative changes on the left. All images personally reviewed  PFTs 12/10/16 FVC 2.90 (76%), FEV1 2.55 [66%], F/F 65, TLC 81%, DLCO 44% Moderate obstructive airway  disease, severe diffusion defect.  Immune serologies 10/06/16 ANA positive, ENA RNP positive  A1 AT 12/12/16- 147, PIMM phenotype. CBC 09/16/17-absolute eosinophil count 88  Assessment:  Severe bullous emphysema COPD Ex smoker He has had a recent COPD exacerbation with left pneumonia requiring multiple rounds of antibiotic.  He continues to improve with chest x-ray today showing resolution of left upper lobe opacity. Inhalers changed Breo to Anoro at last visit he has had a pneumonia.  He does not need to be on inhaled steroids given the low blood eosinophil count.  He has tolerated the change well and will continue on anoro.  Uptodate with vaccines for flu and pneumovax 23. He will need PCV 13 next year  Spontaneous tension pneumothorax  s/p VATS, blebectomy, apical pleurectomy, mechanical pleural abrasion- Nov 2017 BOOP and fibrosis on lung pathology > resolved His lung pathology shows Boop and fibrosis secondary to acute inflammation, chest tube placement during his admission. A follow-up CT scan has shown resolution of any interstitial opacities. An autoimmune workup shows elevation in ANA and ENA which is likely nonspecific.  Lung nodules Subcentimeter pulmonary nodules noted on last CT scan. Follow up CT scan in 1 year (June 2019)  Plan/Recommendations: - Continue anoro - Follow CT scan in 2019  Chilton GreathousePraveen Tersa Fotopoulos MD [facility] Pager [phone] 10/16/2017, 12:13 PM  CC: Corwin LevinsJohn, James W, MD

## 2017-10-23 DIAGNOSIS — J449 Chronic obstructive pulmonary disease, unspecified: Secondary | ICD-10-CM | POA: Diagnosis not present

## 2017-11-23 DIAGNOSIS — J449 Chronic obstructive pulmonary disease, unspecified: Secondary | ICD-10-CM | POA: Diagnosis not present

## 2017-12-07 ENCOUNTER — Other Ambulatory Visit: Payer: Self-pay | Admitting: Internal Medicine

## 2017-12-24 DIAGNOSIS — J449 Chronic obstructive pulmonary disease, unspecified: Secondary | ICD-10-CM | POA: Diagnosis not present

## 2018-01-03 ENCOUNTER — Other Ambulatory Visit: Payer: Self-pay | Admitting: Pulmonary Disease

## 2018-01-21 DIAGNOSIS — J449 Chronic obstructive pulmonary disease, unspecified: Secondary | ICD-10-CM | POA: Diagnosis not present

## 2018-02-03 ENCOUNTER — Telehealth: Payer: Self-pay | Admitting: Internal Medicine

## 2018-02-03 NOTE — Telephone Encounter (Signed)
Yes I will accept him 

## 2018-02-03 NOTE — Telephone Encounter (Signed)
Dr Lawerance BachBurns, Do you know if you were going to accept this patient as a new patient? I am not sure who the patient's mother is.. Please advise.

## 2018-02-03 NOTE — Telephone Encounter (Signed)
Copied from CRM [phone] [id number]. Topic: General - Other >> Feb 03, 2018  2:09 PM Percival SpanishKennedy, Cheryl W wrote:  Jomarie LongsJoseph call to say Dr Lawerance BachBurns told his Mom that she would except him as a pt. Can we confirm this please. Showing Oliver BarreJames John his doctor but he said he has not seen him in about 12 years    [phone]

## 2018-02-08 NOTE — Telephone Encounter (Signed)
Appointment scheduled.

## 2018-02-14 NOTE — Progress Notes (Signed)
Subjective:    Patient ID: Jay LardJoseph T Pelham, male    DOB: 01/17/1956, 62 y.o.   MRN: [id number]  HPI  He is here to establish with a new pcp.   The patient is here for a physical exam.  He has not seen a primary care physician in several years.  COPD: He is following with pulmonary.  He has occasional wheeze.  He wears oxygen at night.  He has SOB with moderate exertion. He uses his inhalers as prescribed, rarely requires the albuterol inhaler.    GERD:  He is taking his medication daily as prescribed.  He denies any GERD symptoms and feels his GERD is well controlled.   He has no concerns.  Medications and allergies reviewed with patient and updated if appropriate.  Patient Active Problem List   Diagnosis Date Noted  . Hyperglycemia 02/15/2018  . PNA (pneumonia) 08/17/2017  . Acute bronchitis 08/03/2017  . Rhinitis, chronic 05/12/2017  . Lung blebs (HCC) 09/17/2016  . Spontaneous tension pneumothorax   . S/P lung surgery, follow-up exam   . GERD (gastroesophageal reflux disease)   . COPD GOLD II    . HYPERCHOLESTEROLEMIA 08/31/2007    Current Outpatient Medications on File Prior to Visit  Medication Sig Dispense Refill  . albuterol (PROVENTIL HFA;VENTOLIN HFA) 108 (90 Base) MCG/ACT inhaler Inhale 2 puffs into the lungs every 6 (six) hours as needed for wheezing or shortness of breath. 1 Inhaler 2  . ANORO ELLIPTA 62.5-25 MCG/INH AEPB INHALE 1 PUFF DAILY INTO THE LUNGS. 60 each 3  . cetirizine (ZYRTEC) 10 MG tablet Take 10 mg by mouth daily.    . diphenhydramine-acetaminophen (TYLENOL PM) 25-500 MG TABS tablet Take 1 tablet by mouth at bedtime as needed.    Marland Kitchen. esomeprazole (NEXIUM) 20 MG capsule Take 20 mg by mouth daily.     . fluticasone (FLONASE) 50 MCG/ACT nasal spray PLACE 1 OR 2 SPRAYS INTO BOTH NOSTRILS 2 TIMES DAILY. 16 g 2  . Multiple Vitamins-Minerals (MULTIVITAMIN PO) Take 1 tablet by mouth daily.    . OXYGEN 2lpm with sleep only    . phenylephrine (SUDAFED PE) 10  MG TABS tablet Take 10 mg by mouth every 4 (four) hours as needed.     No current facility-administered medications on file prior to visit.     Past Medical History:  Diagnosis Date  . COPD (chronic obstructive pulmonary disease) (HCC)   . GERD (gastroesophageal reflux disease)   . Spontaneous pneumothorax 09/15/2016   left    Past Surgical History:  Procedure Laterality Date  . VIDEO ASSISTED THORACOSCOPY Left 09/17/2016   Procedure: VIDEO ASSISTED THORACOSCOPY multiple/BLEBECTOMY, apical pleurectomy, mechanical pleural abrasion;  Surgeon: Loreli SlotSteven C Hendrickson, MD;  [facility] OR;  Service: Thoracic;  Laterality: Left;    Social History   Socioeconomic History  . Marital status: Married    Spouse name: Not on file  . Number of children: Not on file  . Years of education: Not on file  . Highest education level: Not on file  Occupational History  . Not on file  Social Needs  . Financial resource strain: Not on file  . Food insecurity:    Worry: Not on file    Inability: Not on file  . Transportation needs:    Medical: Not on file    Non-medical: Not on file  Tobacco Use  . Smoking status: Former Smoker    Packs/day: 1.50    Years: 25.00  Pack years: 37.50    Types: Cigarettes    Last attempt to quit: 2004    Years since quitting: 15.2  . Smokeless tobacco: Never Used  Substance and Sexual Activity  . Alcohol use: No  . Drug use: No  . Sexual activity: Not on file  Lifestyle  . Physical activity:    Days per week: Not on file    Minutes per session: Not on file  . Stress: Not on file  Relationships  . Social connections:    Talks on phone: Not on file    Gets together: Not on file    Attends religious service: Not on file    Active member of club or organization: Not on file    Attends meetings of clubs or organizations: Not on file    Relationship status: Not on file  Other Topics Concern  . Not on file  Social History Narrative  . Not on file     Family History  Problem Relation Age of Onset  . Osteoporosis Mother   . Hypertension Sister   . Thyroid disease Sister   . Hyperlipidemia Sister   . Anxiety disorder Sister     Review of Systems  Constitutional: Negative for appetite change, chills, fatigue and fever.  Eyes: Negative for visual disturbance.  Respiratory: Positive for shortness of breath (with moderate exertion) and wheezing (occasional). Negative for cough.   Cardiovascular: Negative for chest pain (musculoskeletal chest pain from prior surgery), palpitations and leg swelling.  Gastrointestinal: Negative for abdominal pain, blood in stool, constipation, diarrhea and nausea.  Genitourinary: Negative for difficulty urinating, dysuria and hematuria.  Musculoskeletal: Positive for arthralgias (arthritis) and back pain (mild pain).  Skin: Negative for color change and rash.  Neurological: Positive for light-headedness (occ with exertion). Negative for headaches.  Psychiatric/Behavioral: Negative for dysphoric mood. The patient is not nervous/anxious.        Objective:   Vitals:   02/15/18 0753  BP: 132/78  Pulse: 99  Resp: 16  Temp: 98 F (36.7 C)  SpO2: 96%   BP Readings from Last 3 Encounters:  02/15/18 132/78  10/16/17 128/64  09/16/17 126/76   Wt Readings from Last 3 Encounters:  02/15/18 200 lb (90.7 kg)  10/16/17 195 lb (88.5 kg)  09/16/17 192 lb (87.1 kg)   Body mass index is 26.03 kg/m.   Physical Exam    Constitutional: He appears well-developed and well-nourished. No distress.  HENT:  Head: Normocephalic and atraumatic.  Right Ear: External ear normal.  Left Ear: External ear normal.  Mouth/Throat: Oropharynx is clear and moist.  Normal ear canals and TM b/l  Eyes: Conjunctivae and EOM are normal.  Neck: Neck supple. No tracheal deviation present. No thyromegaly present.  No carotid bruit  Cardiovascular: Normal rate, regular rhythm, normal heart sounds and intact distal pulses.    No murmur heard. Pulmonary/Chest: Effort normal and breath sounds normal. No respiratory distress. He has no wheezes. He has no rales.  Abdominal: Soft. He exhibits no distension. There is no tenderness.  Musculoskeletal: He exhibits no edema.  Lymphadenopathy:   He has no cervical adenopathy.  Skin: Skin is warm and dry. He is not diaphoretic.  Psychiatric: He has a normal mood and affect. His behavior is normal.       Assessment & Plan:   Physical exam: Screening blood work   ordered Immunizations  Td today, others up to date, discussed shingles Colonoscopy - declined --- discussed cologuard - he will consider  Eye exams   Up to date  Exercise  Active - limited with exercise by copd Weight   bmi good for age Skin   No concerns Substance abuse  none  See Problem List for Assessment and Plan of chronic medical problems.   Follow-up once a year, sooner if needed

## 2018-02-15 ENCOUNTER — Ambulatory Visit: Payer: 59 | Admitting: Internal Medicine

## 2018-02-15 ENCOUNTER — Encounter: Payer: Self-pay | Admitting: Internal Medicine

## 2018-02-15 ENCOUNTER — Other Ambulatory Visit (INDEPENDENT_AMBULATORY_CARE_PROVIDER_SITE_OTHER): Payer: 59

## 2018-02-15 VITALS — BP 132/78 | HR 99 | Temp 98.0°F | Resp 16 | Ht 73.5 in | Wt 200.0 lb

## 2018-02-15 DIAGNOSIS — Z0000 Encounter for general adult medical examination without abnormal findings: Secondary | ICD-10-CM

## 2018-02-15 DIAGNOSIS — Z125 Encounter for screening for malignant neoplasm of prostate: Secondary | ICD-10-CM

## 2018-02-15 DIAGNOSIS — J449 Chronic obstructive pulmonary disease, unspecified: Secondary | ICD-10-CM | POA: Diagnosis not present

## 2018-02-15 DIAGNOSIS — E7800 Pure hypercholesterolemia, unspecified: Secondary | ICD-10-CM

## 2018-02-15 DIAGNOSIS — R739 Hyperglycemia, unspecified: Secondary | ICD-10-CM | POA: Insufficient documentation

## 2018-02-15 DIAGNOSIS — K219 Gastro-esophageal reflux disease without esophagitis: Secondary | ICD-10-CM

## 2018-02-15 DIAGNOSIS — Z23 Encounter for immunization: Secondary | ICD-10-CM

## 2018-02-15 LAB — LIPID PANEL
Cholesterol: 215 mg/dL — ABNORMAL HIGH (ref 0–200)
HDL: 37.3 mg/dL — ABNORMAL LOW (ref >39.00)
NONHDL: 177.91
Total CHOL/HDL Ratio: 6
Triglycerides: 359 mg/dL — ABNORMAL HIGH (ref 0.0–149.0)
VLDL: 71.8 mg/dL — ABNORMAL HIGH (ref 0.0–40.0)

## 2018-02-15 LAB — COMPREHENSIVE METABOLIC PANEL
ALK PHOS: 60 U/L (ref 39–117)
ALT: 15 U/L (ref 0–53)
AST: 21 U/L (ref 0–37)
Albumin: 4.2 g/dL (ref 3.5–5.2)
BILIRUBIN TOTAL: 0.5 mg/dL (ref 0.2–1.2)
BUN: 6 mg/dL (ref 6–23)
CO2: 26 mEq/L (ref 19–32)
Calcium: 9.3 mg/dL (ref 8.4–10.5)
Chloride: 104 mEq/L (ref 96–112)
Creatinine, Ser: 1.13 mg/dL (ref 0.40–1.50)
GFR: 69.81 mL/min (ref >60.00)
GLUCOSE: 102 mg/dL — AB (ref 70–99)
Potassium: 4.2 mEq/L (ref 3.5–5.1)
SODIUM: 141 meq/L (ref 135–145)
TOTAL PROTEIN: 7.9 g/dL (ref 6.0–8.3)

## 2018-02-15 LAB — CBC WITH DIFFERENTIAL/PLATELET
BASOS ABS: 0.1 10*3/uL (ref 0.0–0.1)
Basophils Relative: 1.3 % (ref 0.0–3.0)
EOS ABS: 0.1 10*3/uL (ref 0.0–0.7)
Eosinophils Relative: 1.6 % (ref 0.0–5.0)
HCT: 45.5 % (ref 39.0–52.0)
Hemoglobin: 15.7 g/dL (ref 13.0–17.0)
LYMPHS ABS: 2.2 10*3/uL (ref 0.7–4.0)
Lymphocytes Relative: 25.2 % (ref 12.0–46.0)
MCHC: 34.5 g/dL (ref 30.0–36.0)
MCV: 89 fl (ref 78.0–100.0)
MONO ABS: 0.8 10*3/uL (ref 0.1–1.0)
Monocytes Relative: 9.5 % (ref 3.0–12.0)
NEUTROS PCT: 62.4 % (ref 43.0–77.0)
Neutro Abs: 5.4 10*3/uL (ref 1.4–7.7)
Platelets: 344 10*3/uL (ref 150.0–400.0)
RBC: 5.11 Mil/uL (ref 4.22–5.81)
RDW: 13.1 % (ref 11.5–15.5)
WBC: 8.7 10*3/uL (ref 4.0–10.5)

## 2018-02-15 LAB — TSH: TSH: 2.97 u[IU]/mL (ref 0.35–4.50)

## 2018-02-15 LAB — HEMOGLOBIN A1C: HEMOGLOBIN A1C: 5.5 % (ref 4.6–6.5)

## 2018-02-15 LAB — LDL CHOLESTEROL, DIRECT: LDL DIRECT: 130 mg/dL

## 2018-02-15 NOTE — Patient Instructions (Addendum)
  Test(s) ordered today. Your results will be released to MyChart (or called to you) after review, usually within 72hours after test completion. If any changes need to be made, you will be notified at that same time.  All other Health Maintenance issues reviewed.   All recommended immunizations and age-appropriate screenings are up-to-date or discussed.  Consider doing Cologuard.   Tetanus immunization administered today.   Medications reviewed and updated.  No changes recommended at this time.   Please followup in one year

## 2018-02-15 NOTE — Assessment & Plan Note (Signed)
Hyperglycemia in the past-? fasting Check A1c

## 2018-02-15 NOTE — Assessment & Plan Note (Signed)
Has had high cholesterol in the past-no recent lipid panel Was on Crestor for a while, but discontinued this due to feet pain Lipid panel, CMP, TSH today

## 2018-02-15 NOTE — Assessment & Plan Note (Signed)
Following with pulmonary Uses an oral Ellipta daily Rarely uses albuterol inhaler Occasional wheeze, shortness of breath with moderate exertion

## 2018-02-15 NOTE — Assessment & Plan Note (Signed)
GERD controlled Continue daily medication Discussed concerns with taking PPI long-term and discussed possibly trying to get on to Zantac

## 2018-02-16 LAB — PSA, TOTAL AND FREE
PSA, % FREE: 30 % (ref >25)
PSA, FREE: 0.3 ng/mL
PSA, TOTAL: 1 ng/mL (ref <=4.0)

## 2018-02-21 DIAGNOSIS — J449 Chronic obstructive pulmonary disease, unspecified: Secondary | ICD-10-CM | POA: Diagnosis not present

## 2018-02-22 ENCOUNTER — Encounter: Payer: Self-pay | Admitting: Internal Medicine

## 2018-02-23 ENCOUNTER — Other Ambulatory Visit: Payer: Self-pay | Admitting: Internal Medicine

## 2018-03-23 DIAGNOSIS — J449 Chronic obstructive pulmonary disease, unspecified: Secondary | ICD-10-CM | POA: Diagnosis not present

## 2018-04-16 ENCOUNTER — Ambulatory Visit (INDEPENDENT_AMBULATORY_CARE_PROVIDER_SITE_OTHER)
Admission: RE | Admit: 2018-04-16 | Discharge: 2018-04-16 | Disposition: A | Discharge status: Home / Self Care | Payer: 59 | Source: Ambulatory Visit | Attending: Pulmonary Disease | Admitting: Pulmonary Disease

## 2018-04-16 DIAGNOSIS — R911 Solitary pulmonary nodule: Secondary | ICD-10-CM | POA: Diagnosis not present

## 2018-04-21 ENCOUNTER — Other Ambulatory Visit: Payer: Self-pay | Admitting: Pulmonary Disease

## 2018-04-21 DIAGNOSIS — R918 Other nonspecific abnormal finding of lung field: Secondary | ICD-10-CM

## 2018-04-23 DIAGNOSIS — J449 Chronic obstructive pulmonary disease, unspecified: Secondary | ICD-10-CM | POA: Diagnosis not present

## 2018-05-03 ENCOUNTER — Encounter: Payer: Self-pay | Admitting: Pulmonary Disease

## 2018-05-03 ENCOUNTER — Ambulatory Visit: Payer: 59 | Admitting: Pulmonary Disease

## 2018-05-03 ENCOUNTER — Other Ambulatory Visit: Payer: Self-pay | Admitting: Pulmonary Disease

## 2018-05-03 VITALS — BP 102/72 | HR 78 | Ht 73.5 in | Wt 197.6 lb

## 2018-05-03 DIAGNOSIS — J449 Chronic obstructive pulmonary disease, unspecified: Secondary | ICD-10-CM | POA: Diagnosis not present

## 2018-05-03 NOTE — Progress Notes (Signed)
Jay Mitchell    [id number]    [birth year]  Primary Care Physician:Burns, Bobette Mo, MD  Referring Physician: Pincus Sanes, MD [address] [place], [place] [address]  Chief complaint:  Follow-up for  Spontaneous pneumothorax COPD BOOP> resolved Reccurent pnuemonia  HPI: Patient is a 62 year old male with reported history of COPD, remote history of tobacco use, and GE reflux disease who was hospitalized in early  nov 2017 when he developed sudden onset of left-sided chest pain and shortness of breath. At the time he was traveling in the [place] part of [place] for business where he was attending a conference. He was evaluated in the emergency department there and found to have a large left pneumothorax with complete collapse of the left lung, due to ruptured bleb. A small bore chest tube was placed but the lung only partially reexpanded. Subsequently the initial chest tube was removed and the larger chest tube placed by a surgeon. The patient was noted to have persistent air leak, and at the patient's request he was transferred to [facility] on 09/16/16  for further management. Cardiothoracic surgery was consulted for evaluation of left sided pneumothorax and subsequently underwent VATS with multiple blebectomy, apical pleurectomy, and mechanical pleural abrasion on 09/17/16. He is a former smoker who quit in 2004. 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  He has a COPD flare and left-sided pneumonia in October 2018.  Initially treated with doxycycline which he did not tolerate and completed a course of azithromycin and Levaquin with improvement in symptoms.    Interim history: Has stable dyspnea on exertion.  Denies any cough, sputum production Continues on Anoro inhaler.  Outpatient Encounter Medications as of 05/03/2018  Medication Sig  . albuterol (PROVENTIL HFA;VENTOLIN HFA) 108 (90 Base) MCG/ACT  inhaler Inhale 2 puffs into the lungs every 6 (six) hours as needed for wheezing or shortness of breath.  Ailene Ards ELLIPTA 62.5-25 MCG/INH AEPB INHALE 1 PUFF DAILY INTO THE LUNGS.  . cetirizine (ZYRTEC) 10 MG tablet Take 10 mg by mouth daily.  . diphenhydramine-acetaminophen (TYLENOL PM) 25-500 MG TABS tablet Take 1 tablet by mouth at bedtime as needed.  Marland Kitchen esomeprazole (NEXIUM) 20 MG capsule Take 20 mg by mouth daily.   . fluticasone (FLONASE) 50 MCG/ACT nasal spray SPRAY 1 OR 2 TIMES INTO EACH NOSTRIL TWICE A DAY  . Multiple Vitamins-Minerals (MULTIVITAMIN PO) Take 1 tablet by mouth daily.  . OXYGEN 2lpm with sleep only  . phenylephrine (SUDAFED PE) 10 MG TABS tablet Take 10 mg by mouth every 4 (four) hours as needed.   No facility-administered encounter medications on file as of 05/03/2018.     Allergies as of 05/03/2018 - Review Complete 05/03/2018  Allergen Reaction Noted  . Doxycycline Nausea And Vomiting 08/17/2017  . Meperidine hcl Nausea Only     Past Medical History:  Diagnosis Date  . COPD (chronic obstructive pulmonary disease) (HCC)   . GERD (gastroesophageal reflux disease)   . Spontaneous pneumothorax 09/15/2016   left    Past Surgical History:  Procedure Laterality Date  . VIDEO ASSISTED THORACOSCOPY Left 09/17/2016   Procedure: VIDEO ASSISTED THORACOSCOPY multiple/BLEBECTOMY, apical pleurectomy, mechanical pleural abrasion;  Surgeon: Loreli Slot, MD;  Location: [facility] OR;  Service: Thoracic;  Laterality: Left;    Family History  Problem Relation Age of Onset  . Osteoporosis  Mother   . Hypertension Sister   . Thyroid disease Sister   . Hyperlipidemia Sister   . Anxiety disorder Sister     Social History   Socioeconomic History  . Marital status: Married    Spouse name: Not on file  . Number of children: Not on file  . Years of education: Not on file  . Highest education level: Not on file  Occupational History  . Not on file  Social Needs  .  Financial resource strain: Not on file  . Food insecurity:    Worry: Not on file    Inability: Not on file  . Transportation needs:    Medical: Not on file    Non-medical: Not on file  Tobacco Use  . Smoking status: Former Smoker    Packs/day: 1.50    Years: 25.00    Pack years: 37.50    Types: Cigarettes    Last attempt to quit: 2004    Years since quitting: 15.4  . Smokeless tobacco: Never Used  Substance and Sexual Activity  . Alcohol use: No  . Drug use: No  . Sexual activity: Not on file  Lifestyle  . Physical activity:    Days per week: Not on file    Minutes per session: Not on file  . Stress: Not on file  Relationships  . Social connections:    Talks on phone: Not on file    Gets together: Not on file    Attends religious service: Not on file    Active member of club or organization: Not on file    Attends meetings of clubs or organizations: Not on file    Relationship status: Not on file  . Intimate partner violence:    Fear of current or ex partner: Not on file    Emotionally abused: Not on file    Physically abused: Not on file    Forced sexual activity: Not on file  Other Topics Concern  . Not on file  Social History Narrative  . Not on file   Review of systems: Review of Systems  Constitutional: Negative for fever and chills.  HENT: Negative.   Eyes: Negative for blurred vision.  Respiratory: as per HPI  Cardiovascular: Negative for chest pain and palpitations.  Gastrointestinal: Negative for vomiting, diarrhea, blood per rectum. Genitourinary: Negative for dysuria, urgency, frequency and hematuria.  Musculoskeletal: Negative for myalgias, back pain and joint pain.  Skin: Negative for itching and rash.  Neurological: Negative for dizziness, tremors, focal weakness, seizures and loss of consciousness.  Endo/Heme/Allergies: Negative for environmental allergies.  Psychiatric/Behavioral: Negative for depression, suicidal ideas and hallucinations.  All  other systems reviewed and are negative.  Physical Exam: Blood pressure 102/72, pulse 78, height 6' 1.5" (1.867 m), weight 197 lb 9.6 oz (89.6 kg), SpO2 96 %. Gen:      No acute distress HEENT:  EOMI, sclera anicteric Neck:     No masses; no thyromegaly Lungs:    Clear to auscultation bilaterally; normal respiratory effort CV:         Regular rate and rhythm; no murmurs Abd:      + bowel sounds; soft, non-tender; no palpable masses, no distension Ext:    No edema; adequate peripheral perfusion Skin:      Warm and dry; no rash Neuro: alert and oriented x 3 Psych: normal mood and affect  Data Reviewed: Surgical pathology 09/17/16- lung parenchyma with bullae and lung fibrosis,  bronchiolitis obliterans organizing pneumonia.  CT chest without contrast 09/16/16- Severe bullous emphysema. Pneumothrax, B/L LL consolidations R > L.  High-resolution CT of the chest 11/25/16-  no evidence of interstitial lung disease, postsurgical scarring. Several subcentimeter scattered pulmonary lymph nodes. Moderate-severe emphysema. Chest x-ray 08/03/17-left lung infiltrate with multilobar pneumonia, postsurgical changes in the left lung Chest x-ray 08/27/17-improving left lung infiltrate Chest x-ray 09/16/17- stable left lung apical opacity Chest x-ray 10/16/17-clearing of left upper lobe infiltrate.  Stable scarring, postoperative changes on the left. CT scan without contrast 04/16/2018- advanced emphysematous changes.  Stable lung nodules. I reviewed the images personally.  PFTs 12/10/16 FVC 2.90 (76%), FEV1 2.55 [66%], F/F 65, TLC 81%, DLCO 44% Moderate obstructive airway disease, severe diffusion defect.  Immune serologies 10/06/16 ANA positive, ENA RNP positive  A1 AT 12/12/16- 147, PIMM phenotype. CBC 09/16/17-absolute eosinophil count 88  Assessment:  Severe bullous emphysema COPD Ex smoker Stable on anoro.  Avoiding inhaled steroids as he developed pneumonia in 2018  Spontaneous tension  pneumothorax s/p VATS, blebectomy, apical pleurectomy, mechanical pleural abrasion- Nov 2017 BOOP and fibrosis on lung pathology > resolved His lung pathology shows Boop and fibrosis secondary to acute inflammation, chest tube placement during his admission. A follow-up CT scan has shown resolution of any interstitial opacities. An autoimmune workup shows elevation in ANA and ENA which is likely nonspecific.  Lung nodules Sub centimeter pulmonary nodules are stable. We can follow-up with CT in 1 year.  If there is no growth then we can stop scans  Health maintenance 09/19/2017-influenza vaccine 09/16/2017-Pneumovax  Plan/Recommendations: - Continue Anoro - Follow-up CT without contrast in 1 year.  Chilton GreathousePraveen Abbygayle Helfand MD [facility] 05/03/2018, 3:03 PM  CC: Pincus SanesBurns, Stacy J, MD

## 2018-05-03 NOTE — Patient Instructions (Signed)
I am glad that your breathing is stable Continue the Anoro inhaler His CT scan shows that the lung nodules are stable and likely benign.  We will follow-up in 1 year with CT without contrast Follow-up in clinic after CT scan in 1 year.

## 2018-05-23 DIAGNOSIS — J449 Chronic obstructive pulmonary disease, unspecified: Secondary | ICD-10-CM | POA: Diagnosis not present

## 2018-06-11 DIAGNOSIS — H5203 Hypermetropia, bilateral: Secondary | ICD-10-CM | POA: Diagnosis not present

## 2018-06-23 DIAGNOSIS — J449 Chronic obstructive pulmonary disease, unspecified: Secondary | ICD-10-CM | POA: Diagnosis not present

## 2018-07-24 DIAGNOSIS — J449 Chronic obstructive pulmonary disease, unspecified: Secondary | ICD-10-CM | POA: Diagnosis not present

## 2018-07-26 ENCOUNTER — Encounter: Payer: Self-pay | Admitting: Internal Medicine

## 2018-08-05 DIAGNOSIS — Z23 Encounter for immunization: Secondary | ICD-10-CM | POA: Diagnosis not present

## 2018-08-23 DIAGNOSIS — J449 Chronic obstructive pulmonary disease, unspecified: Secondary | ICD-10-CM | POA: Diagnosis not present

## 2018-08-25 ENCOUNTER — Other Ambulatory Visit: Payer: Self-pay | Admitting: Pulmonary Disease

## 2018-09-23 DIAGNOSIS — J449 Chronic obstructive pulmonary disease, unspecified: Secondary | ICD-10-CM | POA: Diagnosis not present

## 2018-10-23 DIAGNOSIS — J449 Chronic obstructive pulmonary disease, unspecified: Secondary | ICD-10-CM | POA: Diagnosis not present

## 2018-11-23 DIAGNOSIS — J449 Chronic obstructive pulmonary disease, unspecified: Secondary | ICD-10-CM | POA: Diagnosis not present

## 2018-12-07 ENCOUNTER — Telehealth: Payer: Self-pay | Admitting: Pulmonary Disease

## 2018-12-07 NOTE — Telephone Encounter (Signed)
Pt is aware that all disability paperwork is taken care of through our [employer] dept; he will need to call [phone]. Nothing more needed at this time.

## 2018-12-24 DIAGNOSIS — J449 Chronic obstructive pulmonary disease, unspecified: Secondary | ICD-10-CM | POA: Diagnosis not present

## 2018-12-31 ENCOUNTER — Telehealth: Payer: Self-pay | Admitting: Pulmonary Disease

## 2018-12-31 MED ORDER — UMECLIDINIUM-VILANTEROL 62.5-25 MCG/INH IN AEPB: INHALATION_SPRAY | RESPIRATORY_TRACT | 3 refills | Status: DC

## 2018-12-31 NOTE — Telephone Encounter (Signed)
Patient requested prescription for Anoro to be sent to [facility] [address] Anoro prescription sent.  Nothing further at this time.+  Per 05/03/18- Instructions   I am glad that your breathing is stable Continue the Anoro inhaler His CT scan shows that the lung nodules are stable and likely benign.  We will follow-up in 1 year with CT without contrast Follow-up in clinic after CT scan in 1 year.

## 2019-01-22 DIAGNOSIS — J449 Chronic obstructive pulmonary disease, unspecified: Secondary | ICD-10-CM | POA: Diagnosis not present

## 2019-02-22 DIAGNOSIS — J449 Chronic obstructive pulmonary disease, unspecified: Secondary | ICD-10-CM | POA: Diagnosis not present

## 2019-02-23 DIAGNOSIS — J449 Chronic obstructive pulmonary disease, unspecified: Secondary | ICD-10-CM | POA: Diagnosis not present

## 2019-03-24 DIAGNOSIS — J449 Chronic obstructive pulmonary disease, unspecified: Secondary | ICD-10-CM | POA: Diagnosis not present

## 2019-04-09 IMAGING — CT CT CHEST W/O CM
2 of 3 series · 15 of 36 positions shown, 18 images · non-contrast
Comparison: Chest CT 11/25/2016 and 09/16/2016. Radiographs
10/07/2016.

CLINICAL DATA: Follow up pulmonary nodules. History of COPD and
spontaneous pneumothorax. No given history of malignancy.

EXAM:
CT CHEST WITHOUT CONTRAST
TECHNIQUE: Multidetector CT imaging of the chest was performed following the
standard protocol without IV contrast.

[Series 2: thorax · axial · 0.81mm/px · z∈[+1201,+1455]mm · 12 of 151 slices shown, 15 images]
[im 12/151  mediastinal]
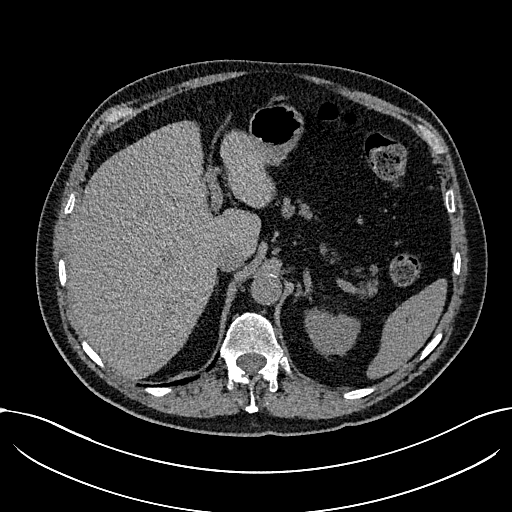
[im 12/151  lung]
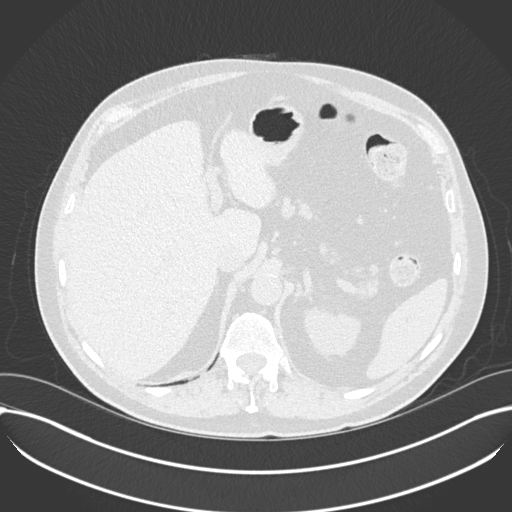
[im 23/151  lung]
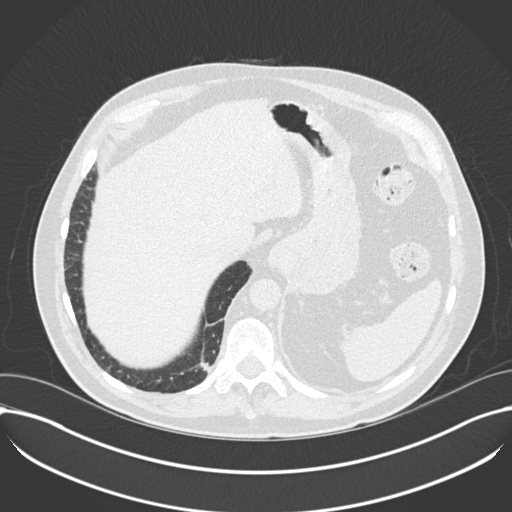
[im 34/151  lung]
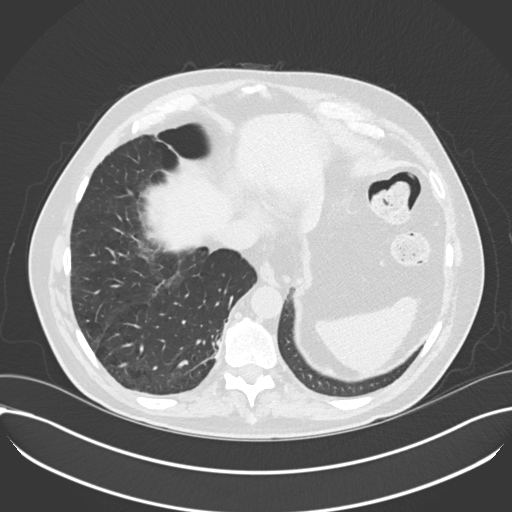
[im 45/151  lung]
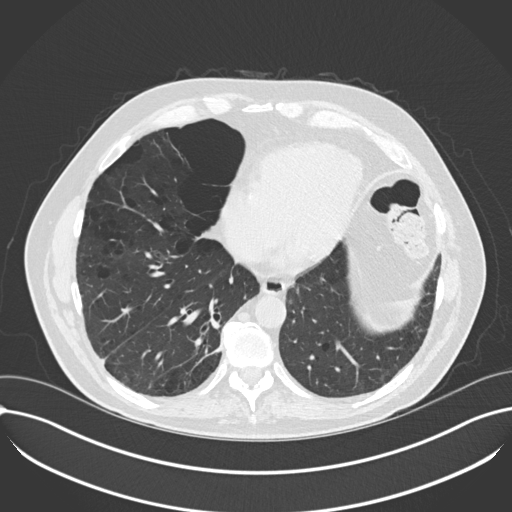
[im 56/151  mediastinal]
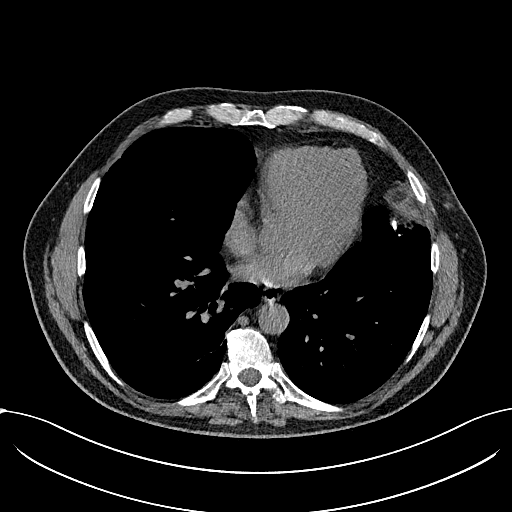
[im 56/151  lung]
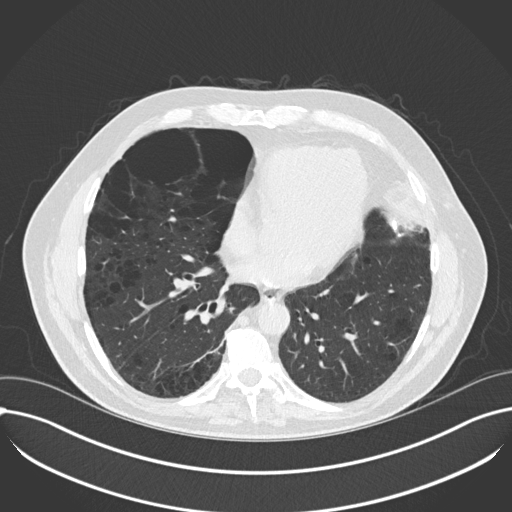
[im 67/151  lung]
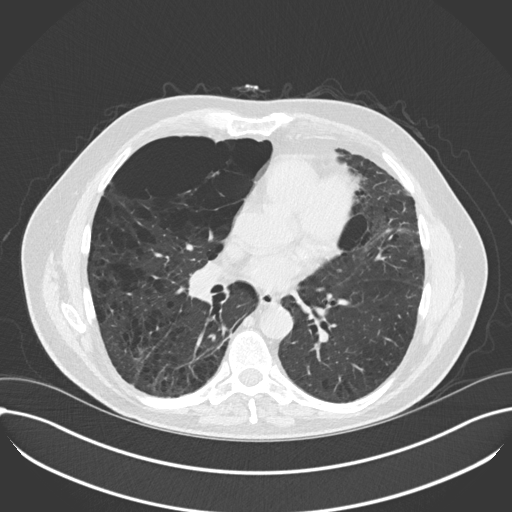
[im 84/151  lung]
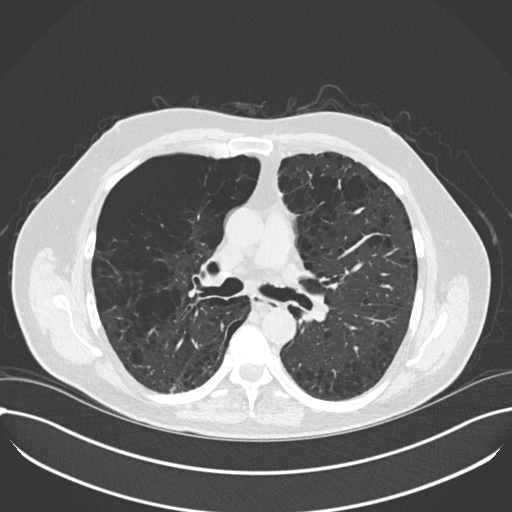
[im 95/151  lung]
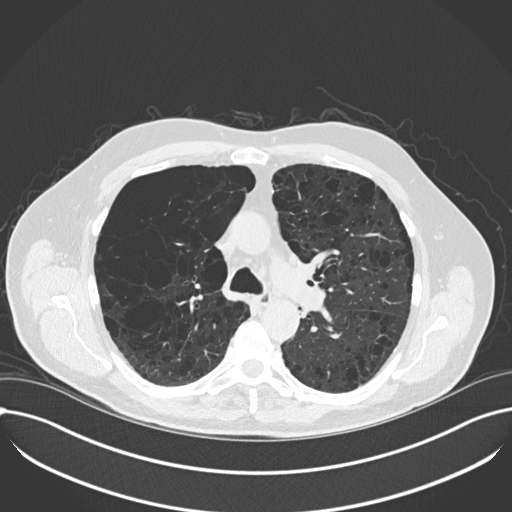
[im 106/151  mediastinal]
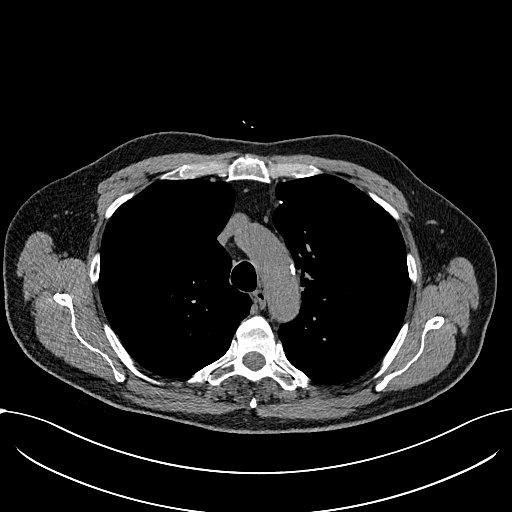
[im 106/151  lung]
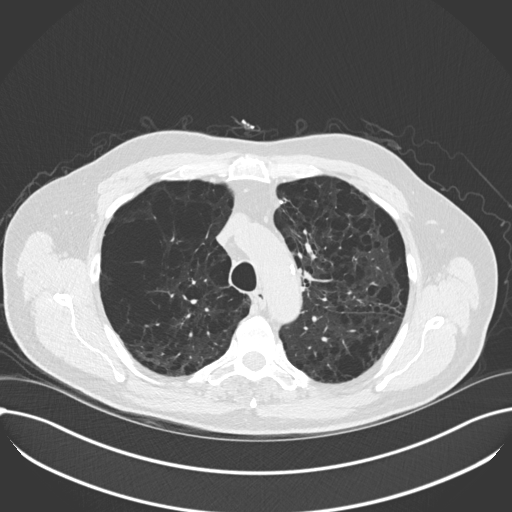
[im 117/151  lung]
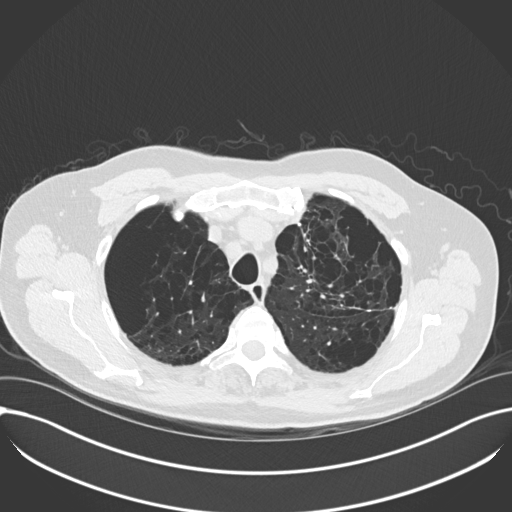
[im 128/151  lung]
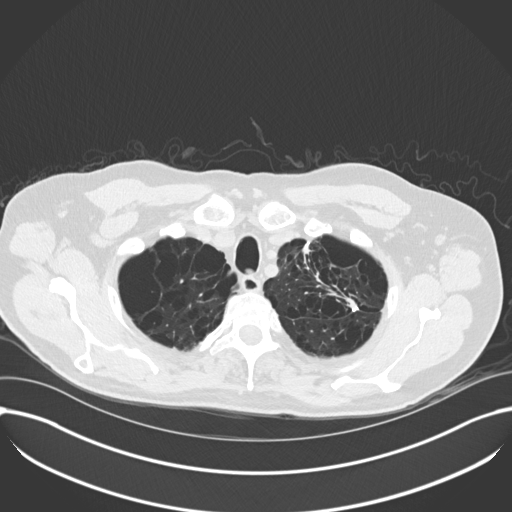
[im 139/151  lung]
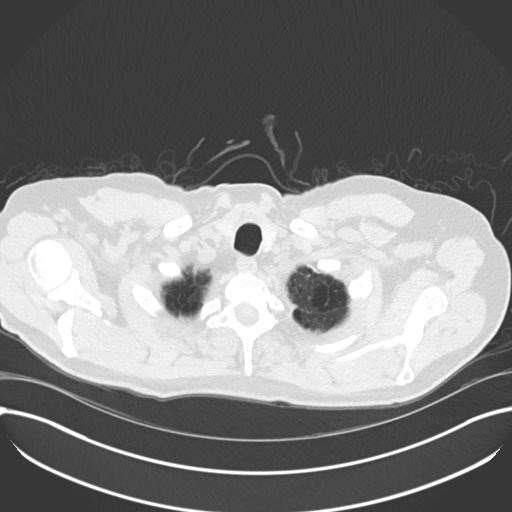

[Series 5: coronal · coronal · 0.61mm/px · 3 of 149 slices shown]
[im 30/149  lung]
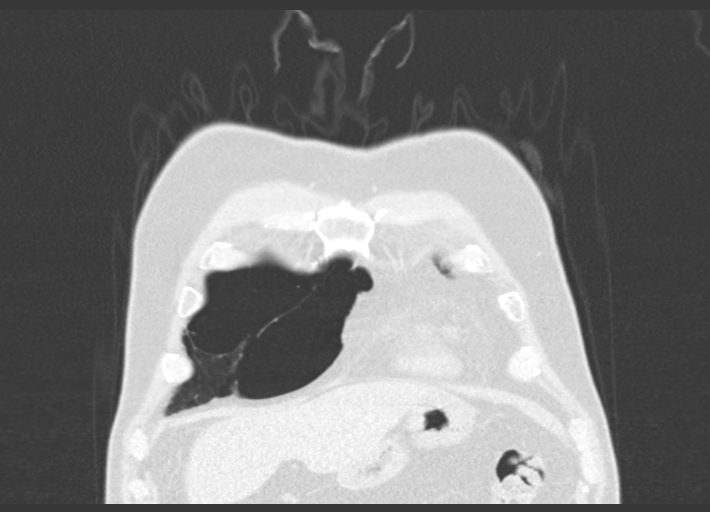
[im 60/149  lung]
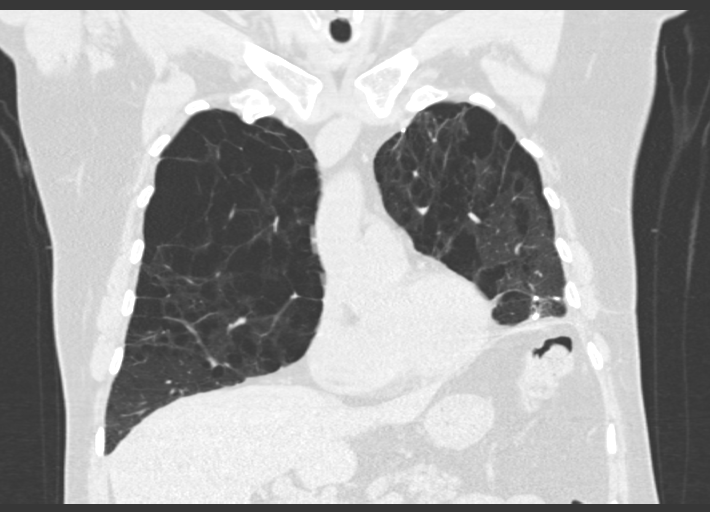
[im 89/149  lung]
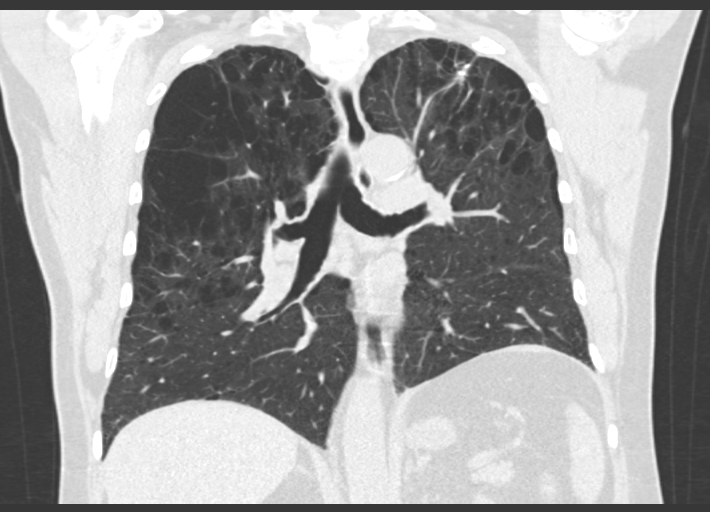

[15 of 36 positions shown; findings below may reference images not displayed]

FINDINGS: Cardiovascular: Mild aortic and great vessel atherosclerosis. No
acute vascular findings are seen on noncontrast imaging. The heart
size is normal. There is no pericardial effusion.

Mediastinum/Nodes: There are no enlarged mediastinal, hilar or
axillary lymph nodes.Hilar assessment is limited by the lack of
intravenous contrast, although the hilar contours appear unchanged.
The thyroid gland, trachea and esophagus demonstrate no significant
findings.

Lungs/Pleura: No pleural effusion or pneumothorax. Stable
postsurgical changes in the left upper lobe and lingula. There is
severe centrilobular and paraseptal emphysema with an upper lobe
predominance. Scattered small pulmonary nodules are stable, largest
perifissural along the inferior aspect of the right major fissure on
image 92, measuring 4 x 8 mm (stable from 5572 exam). No new,
enlarging or morphologically suspicious nodules.

Upper abdomen: The visualized upper abdomen appears stable without
suspicious findings.

Musculoskeletal/Chest wall: There is no chest wall mass or
suspicious osseous finding.
IMPRESSION: 1. Stable small pulmonary nodules bilaterally, not morphologically
suspicious. Given risk factors for lung cancer, consider CT
follow-up in 12 months. This recommendation follows the consensus
statement: Guidelines for Management of Small Pulmonary Nodules
Detected on CT Images: From the [HOSPITAL] 5572; Radiology
5572; [DATE].
2. Severe emphysema with postsurgical changes in the left upper
lobe. Emphysema (TNY78-YOR.S).
3. Mild Aortic Atherosclerosis (TNY78-CVA.A).

## 2019-04-25 ENCOUNTER — Other Ambulatory Visit: Payer: Self-pay | Admitting: Pulmonary Disease

## 2019-05-20 ENCOUNTER — Telehealth: Payer: Self-pay | Admitting: Pulmonary Disease

## 2019-05-20 NOTE — Telephone Encounter (Signed)
Script Screening patients for COVID-19 and reviewing new operational procedures  Greeting - The reason I am calling is to share with you some new changes to our processes that are designed to help [place] keep everyone safe. Is now a good time to speak with you? Patient says "no' - ask them when you can call back and let them know it's important to do this prior to their appointment.  Patient says "yes" - Great, (insert pt name) the first thing I need to do is ask you some screening Questions.  1. To the best of your knowledge, have you been in close contact with any one with a confirmed diagnosis of COVID 19? o No - proceed to next question  2. Have you had any one or more of the following: fever, chills, cough, shortness of breath or any flu-like symptoms? o No - proceed to next question  3. Have you been diagnosed with or have a previous diagnosis of COVID 19? o No - proceed to next question  4. I am going to go over a few other symptoms with you. Please let me know if you are experiencing any of the following: . Ear, nose or throat discomfort . A sore throat . Headache . Muscle pain . Diarrhea . Loss of taste or smell o No - proceed to next question  Thank you for answering these questions. Please know we will ask you these questions or similar questions when you arrive for your appointment and again it's how we are keeping everyone safe. Also, to keep you safe, please use the provided hand sanitizer when you enter the building. (Insert pt name), we are asking everyone in the building to wear a mask because they help [place] prevent the spread of germs. Do you have a mask of your own, if not, we are happy to provide one for you. The last thing I want to go over with you is the no visitor guidelines. This means no one can attend the appointment with you unless you need physical assistance. I understand this may be different from your past appointments and I know this may be difficult  but please know if someone is driving you we are happy to call them for you once your appointment is over.  [INSERT [place]  (Insert pt name) I've given you a lot of information, what questions do you have about what I've talked about today or your appointment tomorrow?

## 2019-05-20 NOTE — Telephone Encounter (Signed)
Script Screening patients for COVID-19 and reviewing new operational procedures  Greeting - The reason I am calling is to share with you some new changes to our processes that are designed to help [place] keep everyone safe. Is now a good time to speak with you? Patient says "no' - ask them when you can call back and let them know it's important to do this prior to their appointment.  Patient says "yes" - Tyrelle, Raczka the first thing I need to do is ask you some screening Questions.  1. To the best of your knowledge, have you been in close contact with any one with a confirmed diagnosis of COVID 19? o No - proceed to next question  2. Have you had any one or more of the following: fever, chills, cough, shortness of breath or any flu-like symptoms? o No - proceed to next question  3. Have you been diagnosed with or have a previous diagnosis of COVID 19? o No - proceed to next question  4. I am going to go over a few other symptoms with you. Please let me know if you are experiencing any of the following: . Ear, nose or throat discomfort . A sore throat . Headache . Muscle pain . Diarrhea . Loss of taste or smell o No - proceed to next question  Thank you for answering these questions. Please know we will ask you these questions or similar questions when you arrive for your appointment and again it's how we are keeping everyone safe. Also, to keep you safe, please use the provided hand sanitizer when you enter the building. (Insert pt name), we are asking everyone in the building to wear a mask because they help [place] prevent the spread of germs. Do you have a mask of your own, if not, we are happy to provide one for you. The last thing I want to go over with you is the no visitor guidelines. This means no one can attend the appointment with you unless you need physical assistance. I understand this may be different from your past appointments and I know this may be difficult but please  know if someone is driving you we are happy to call them for you once your appointment is over.  [INSERT [place]  (Insert pt name) I've given you a lot of information, what questions do you have about what I've talked about today or your appointment tomorrow?  Benjie Karvonen, CMA

## 2019-05-23 ENCOUNTER — Ambulatory Visit (INDEPENDENT_AMBULATORY_CARE_PROVIDER_SITE_OTHER)
Admission: RE | Admit: 2019-05-23 | Discharge: 2019-05-23 | Disposition: A | Discharge status: Home / Self Care | Payer: 59 | Source: Ambulatory Visit | Attending: Pulmonary Disease | Admitting: Pulmonary Disease

## 2019-05-23 ENCOUNTER — Other Ambulatory Visit: Payer: Self-pay

## 2019-05-23 DIAGNOSIS — R918 Other nonspecific abnormal finding of lung field: Secondary | ICD-10-CM

## 2019-05-30 ENCOUNTER — Other Ambulatory Visit: Payer: Self-pay

## 2019-05-30 ENCOUNTER — Encounter: Payer: Self-pay | Admitting: Pulmonary Disease

## 2019-05-30 ENCOUNTER — Ambulatory Visit: Payer: 59 | Admitting: Pulmonary Disease

## 2019-05-30 VITALS — BP 136/84 | HR 100 | Temp 97.9°F | Ht 73.0 in | Wt 196.4 lb

## 2019-05-30 DIAGNOSIS — J449 Chronic obstructive pulmonary disease, unspecified: Secondary | ICD-10-CM

## 2019-05-30 MED ORDER — ALBUTEROL SULFATE HFA 108 (90 BASE) MCG/ACT IN AERS: 2.0000 | INHALATION_SPRAY | Freq: Four times a day (QID) | RESPIRATORY_TRACT | 11 refills | Status: AC | PRN

## 2019-05-30 NOTE — Addendum Note (Signed)
Addended by: Hildred Alamin I on: 05/30/2019 03:02 PM   Modules accepted: Orders

## 2019-05-30 NOTE — Progress Notes (Signed)
Jay Mitchell    [id number]    1956/04/16  Primary Care Physician:Burns, Claudina Lick, MD  Referring Physician: Binnie Rail, MD [place],   [id number]  Chief complaint:  Follow-up for  Spontaneous pneumothorax COPD BOOP> resolved Reccurent pnuemonia  HPI: Patient is a 63 year old male with reported history of COPD, remote history of tobacco use, and GE reflux disease who was hospitalized in early  nov 2017 when he developed sudden onset of left-sided chest pain and shortness of breath. At the time he was traveling in the [place] part of [place] for business where he was attending a conference. He was evaluated in the emergency department there and found to have a large left pneumothorax with complete collapse of the left lung, due to ruptured bleb. A small bore chest tube was placed but the lung only partially reexpanded. Subsequently the initial chest tube was removed and the larger chest tube placed by a surgeon. The patient was noted to have persistent air leak, and at the patient's request he was transferred to [facility] on 09/16/16  for further management. Cardiothoracic surgery was consulted for evaluation of left sided pneumothorax and subsequently underwent VATS with multiple blebectomy, apical pleurectomy, and mechanical pleural abrasion on 09/17/16. He is a former smoker who quit in 2004. 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 He does not report any exposures at work or at home.  He has a COPD flare and left-sided pneumonia in October 2018.  Initially treated with doxycycline which he did not tolerate and completed a course of azithromycin and Levaquin with improvement in symptoms.    Interim history: Has stable dyspnea on exertion.  Denies any cough, sputum production Continues on Anoro inhaler.  Outpatient Encounter Medications as of 05/30/2019  Medication Sig  . albuterol (PROVENTIL HFA;VENTOLIN HFA) 108 (90 Base) MCG/ACT  inhaler Inhale 2 puffs into the lungs every 6 (six) hours as needed for wheezing or shortness of breath.  Jearl Klinefelter ELLIPTA 62.5-25 MCG/INH AEPB INHALE 1 PUFF BY MOUTH EVERY DAY  . cetirizine (ZYRTEC) 10 MG tablet Take 10 mg by mouth daily.  . diphenhydramine-acetaminophen (TYLENOL PM) 25-500 MG TABS tablet Take 1 tablet by mouth at bedtime as needed.  Marland Kitchen esomeprazole (NEXIUM) 20 MG capsule Take 20 mg by mouth daily.   . fluticasone (FLONASE) 50 MCG/ACT nasal spray SPRAY 1 OR 2 TIMES INTO EACH NOSTRIL TWICE A DAY  . lansoprazole (PREVACID SOLUTAB) 15 MG disintegrating tablet Take 15 mg by mouth daily at 12 noon.  . Multiple Vitamins-Minerals (MULTIVITAMIN PO) Take 1 tablet by mouth daily.  . OXYGEN 2lpm with sleep only  . phenylephrine (SUDAFED PE) 10 MG TABS tablet Take 10 mg by mouth every 4 (four) hours as needed.   No facility-administered encounter medications on file as of 05/30/2019.    Review of systems: Review of Systems  Constitutional: Negative for fever and chills.  HENT: Negative.   Eyes: Negative for blurred vision.  Respiratory: as per HPI  Cardiovascular: Negative for chest pain and palpitations.  Gastrointestinal: Negative for vomiting, diarrhea, blood per rectum. Genitourinary: Negative for dysuria, urgency, frequency and hematuria.  Musculoskeletal: Negative for myalgias, back pain and joint pain.  Skin: Negative for itching and rash.  Neurological: Negative for dizziness, tremors, focal weakness, seizures and loss of consciousness.  Endo/Heme/Allergies: Negative for environmental allergies.  Psychiatric/Behavioral: Negative for depression, suicidal ideas and hallucinations.  All  other systems reviewed and are negative.  Physical Exam: Blood pressure 102/72, pulse 78, height 6' 1.5" (1.867 m), weight 197 lb 9.6 oz (89.6 kg), SpO2 96 %. Gen:      No acute distress HEENT:  EOMI, sclera anicteric Neck:     No masses; no thyromegaly Lungs:    Clear to auscultation  bilaterally; normal respiratory effort CV:         Regular rate and rhythm; no murmurs Abd:      + bowel sounds; soft, non-tender; no palpable masses, no distension Ext:    No edema; adequate peripheral perfusion Skin:      Warm and dry; no rash Neuro: alert and oriented x 3 Psych: normal mood and affect  Data Reviewed: Surgical pathology 09/17/16- lung parenchyma with bullae and lung fibrosis,  bronchiolitis obliterans organizing pneumonia.   CT chest without contrast 09/16/16- Severe bullous emphysema. Pneumothrax, B/L LL consolidations R > L.  High-resolution CT of the chest 11/25/16-  no evidence of interstitial lung disease, postsurgical scarring. Several subcentimeter scattered pulmonary lymph nodes. Moderate-severe emphysema. Chest x-ray 08/03/17-left lung infiltrate with multilobar pneumonia, postsurgical changes in the left lung Chest x-ray 08/27/17-improving left lung infiltrate Chest x-ray 09/16/17- stable left lung apical opacity Chest x-ray 10/16/17-clearing of left upper lobe infiltrate.  Stable scarring, postoperative changes on the left. CT chest 04/16/2018- advanced emphysematous changes.  Stable lung nodules. CT chest 05/23/2019- stable findings of emphysema and lung nodules I reviewed the images personally.  PFTs 12/10/16 FVC 2.90 (76%), FEV1 2.55 [66%], F/F 65, TLC 81%, DLCO 44% Moderate obstructive airway disease, severe diffusion defect.  Immune serologies 10/06/16 ANA positive, ENA RNP positive  A1 AT 12/12/16- 147, PIMM phenotype. CBC 09/16/17-absolute eosinophil count 88  Assessment:  Severe bullous emphysema COPD Ex smoker Stable on anoro.  Avoiding inhaled steroids as he developed pneumonia in 2018  Spontaneous tension pneumothorax s/p VATS, blebectomy, apical pleurectomy, mechanical pleural abrasion- Nov 2017 BOOP and fibrosis on lung pathology > resolved His lung pathology shows Boop and fibrosis secondary to acute inflammation, chest tube placement during his  admission. A follow-up CT scan has shown resolution of any interstitial opacities. An autoimmune workup shows elevation in ANA and ENA which is likely nonspecific.  Lung nodules Has remained stable since 2017.  No further follow-up required  Health maintenance 09/16/2017-Pneumovax  Plan/Recommendations: - Continue Jolyn LentAnoro  Ivery Nanney MD [facility] 05/30/2019, 1:53 PM  CC: Pincus SanesBurns, Stacy J, MD

## 2019-05-30 NOTE — Patient Instructions (Signed)
Glad you are doing well with regard to the breathing. Continue Anoro inhaler I will see you back in 1 year Please give a call sooner if there is any change in your symptoms.

## 2019-06-15 ENCOUNTER — Other Ambulatory Visit: Payer: Self-pay | Admitting: Pulmonary Disease

## 2019-07-11 NOTE — Telephone Encounter (Signed)
PCCs, can you help with this? Thank you.  

## 2019-07-11 NOTE — Telephone Encounter (Signed)
Please reach out to our [employer] liason and follow up on this . Unfortunately we do not have anything to do with billing issues with [employer] .

## 2019-07-11 NOTE — Telephone Encounter (Addendum)
Pt sent the following MyChart message 07/11/2019 at 2:10 PM EDT:  "I am really having problems with [employer].  I accidently double paid an invoice in May and they never applied the extra payment to my account. I deducted it from my June invoice and now they are sending me past due notices.  I have called, emailed them and no success.  Told me that they would not send me any supplies for my O2 concentrator  until my account was current.  I have had nothing but problems with this company.    Can you please provide me information about some other companies that I can speak to about renting or purchasing an O2 concentrator, tubing and cannulas.  Thanks, Octavia Bruckner"  According to our records, an order for O2 2L has not been sent since 02/12/2017. Pt mentioned his account with [employer] is not current. Pt last seen 05/30/2019 by Dr. Vaughan Browner. In the note, Oxygen 2L with sleep is listed on patient's medication list. Pt states he is still using O2 2L at night and as needed. He strongly reports wanting to switch DME companies if it is possible.   Since Dr. Vaughan Browner is not in the office today, I am routing this message to Rexene Edison NP for follow up, since she has seen pt in the past.  TP, please advise with your recommendations for this pt. Thank you.

## 2019-07-12 NOTE — Telephone Encounter (Signed)
I have sent a CM to [place] with [employer] to help with this issue.  I will provide an update as soon ASAP.

## 2019-07-12 NOTE — Progress Notes (Deleted)
I have sent a CM to [place]thal[place] with [employer] to help with this issue.

## 2019-07-12 NOTE — Telephone Encounter (Signed)
I called and spoke to [facility] at [place] she is going to have billing call both patients

## 2019-09-17 IMAGING — DX DG CHEST 2V
2 series · 2 of 2 positions shown · non-contrast
Comparison: Chest radiographs September 16, 2017 and August 17, 2017;
chest CT May 08, 2017

CLINICAL DATA: Recent pneumonia

EXAM:
CHEST  2 VIEW

[chest pa]
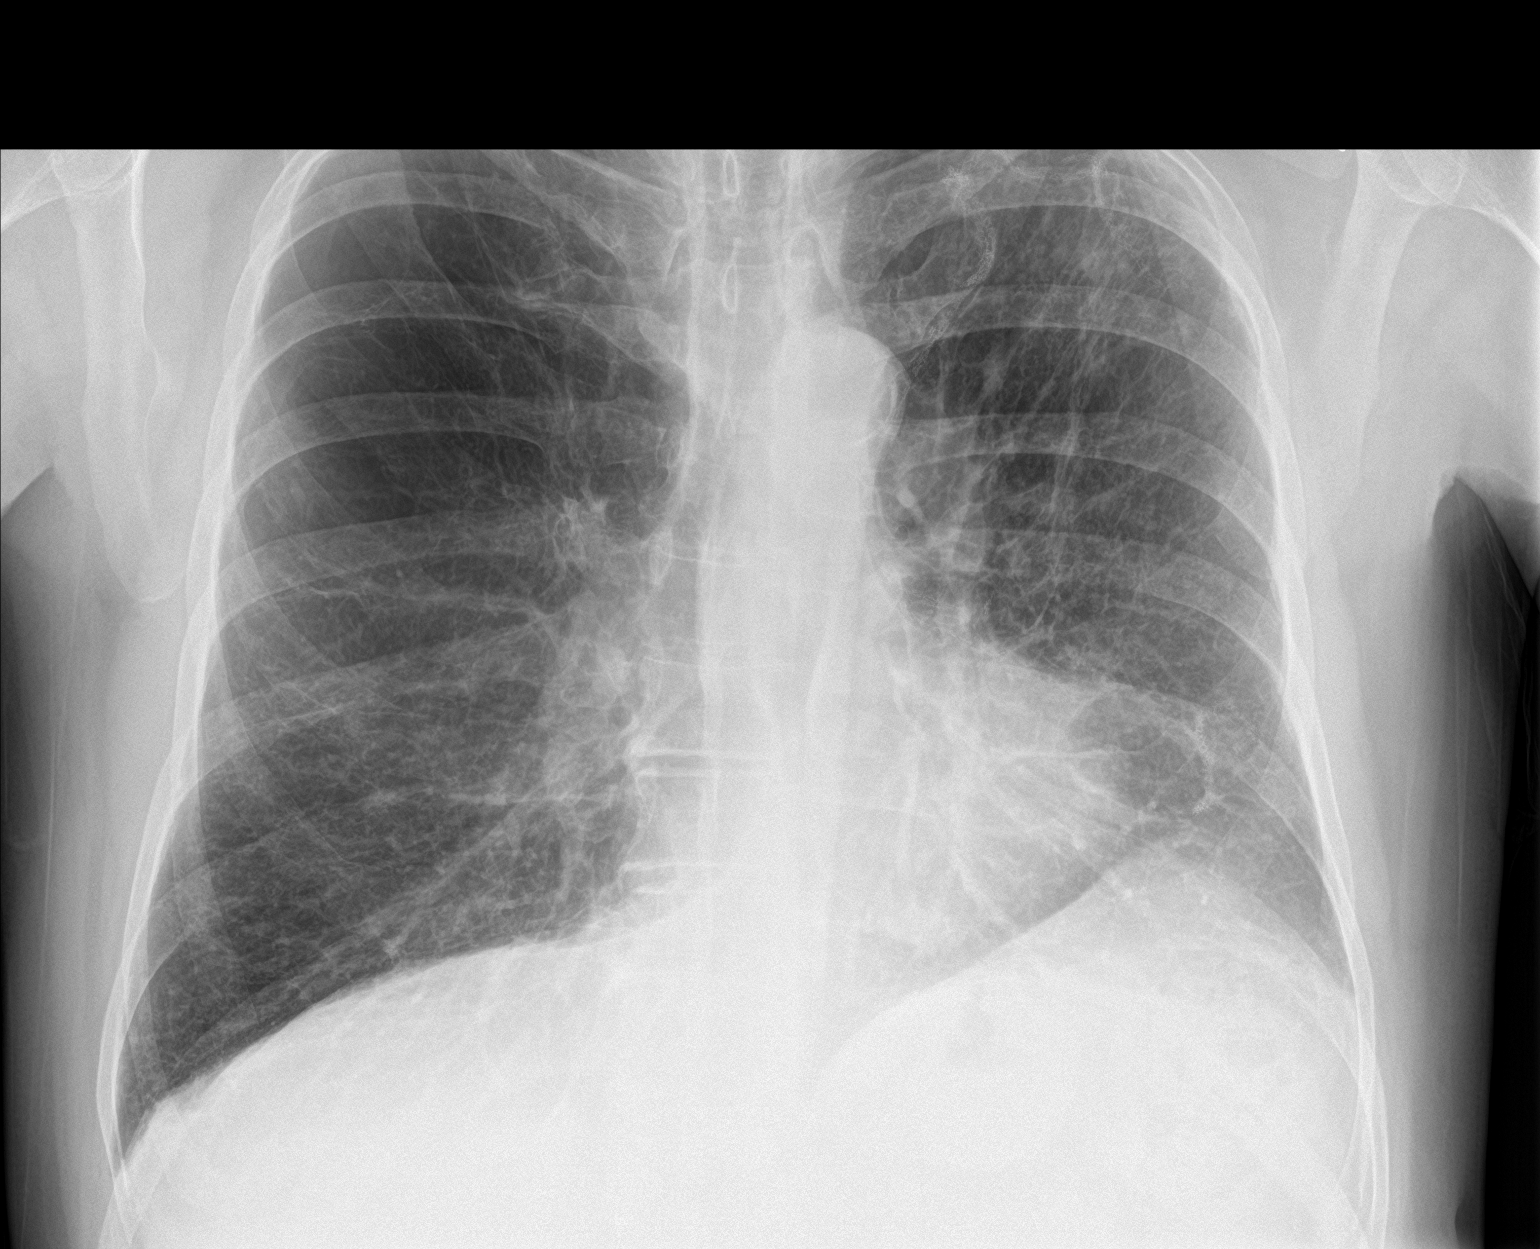

[chest lat]
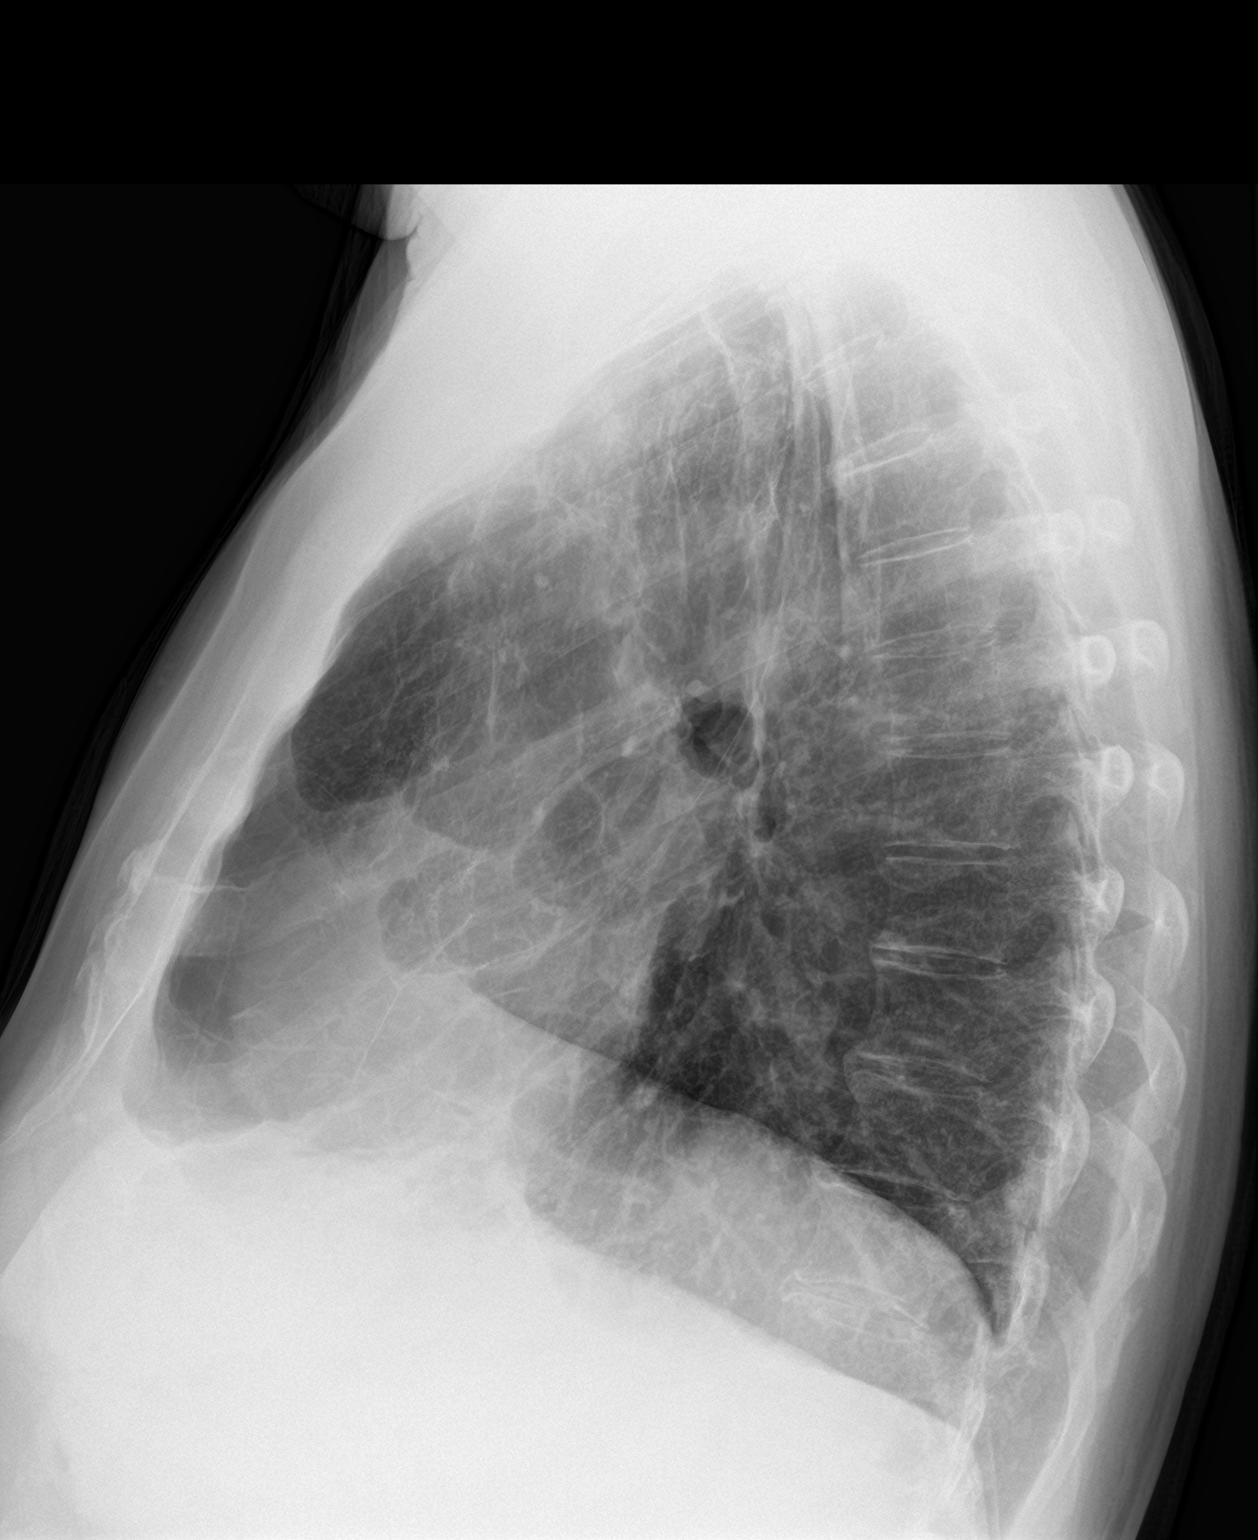

[2 of 2 positions shown; findings below may reference images not displayed]

FINDINGS: There has been interval clearing of patchy airspace consolidation
from left upper lobe. A small amount of residual opacity remains in
this area. Note that there is underlying scarring and postoperative
change in the left upper lobe. There is also postoperative change in
the left base with mild elevation of the left hemidiaphragm.

There is underlying bullous disease in both upper lobes.
Interstitial prominence in the lower lung zones is primarily felt to
be due to redistribution of blood flow to viable lung segments. A
new lung opacity is evident.

Heart size is normal. Pulmonary vascularity reflects the underlying
bullous emphysematous change with decreased vascularity to the upper
lobes, stable. No adenopathy. There is aortic atherosclerosis. There
are no evident bone lesions.
IMPRESSION: Near complete clearing of infiltrate from left upper lobe.
Underlying scarring in the left upper lobe. There are areas of
postoperative change on the left, stable.

There is underlying bullous emphysematous change, stable. Stable
cardiac silhouette. There is aortic atherosclerosis.

Aortic Atherosclerosis (SVGHG-3EU.U) and Emphysema (SVGHG-ZQ3.Z).

## 2020-05-24 NOTE — Telephone Encounter (Signed)
Dr. Isaiah Serge, this patient is requesting a 3 month refill for Anoro. This was last filled on 06/15/2019. Can we send it?

## 2020-05-25 MED ORDER — ANORO ELLIPTA 62.5-25 MCG/INH IN AEPB: INHALATION_SPRAY | RESPIRATORY_TRACT | 3 refills | Status: DC

## 2020-05-25 NOTE — Telephone Encounter (Signed)
Yes. Renew Anoro for a year with 3 month refills

## 2020-07-09 ENCOUNTER — Encounter: Payer: Self-pay | Admitting: Pulmonary Disease

## 2020-07-09 ENCOUNTER — Ambulatory Visit (INDEPENDENT_AMBULATORY_CARE_PROVIDER_SITE_OTHER): Payer: Medicare Other | Admitting: Pulmonary Disease

## 2020-07-09 ENCOUNTER — Ambulatory Visit (INDEPENDENT_AMBULATORY_CARE_PROVIDER_SITE_OTHER): Payer: Medicare Other

## 2020-07-09 ENCOUNTER — Other Ambulatory Visit: Payer: Self-pay

## 2020-07-09 VITALS — BP 140/90 | HR 108 | Temp 97.2°F | Ht 73.0 in | Wt 202.0 lb

## 2020-07-09 DIAGNOSIS — J449 Chronic obstructive pulmonary disease, unspecified: Secondary | ICD-10-CM

## 2020-07-09 DIAGNOSIS — Z23 Encounter for immunization: Secondary | ICD-10-CM

## 2020-07-09 MED ORDER — ANORO ELLIPTA 62.5-25 MCG/INH IN AEPB: INHALATION_SPRAY | RESPIRATORY_TRACT | 3 refills | Status: DC

## 2020-07-09 NOTE — Addendum Note (Signed)
Addended by: Delrae Rend on: 07/09/2020 03:23 PM   Modules accepted: Orders

## 2020-07-09 NOTE — Addendum Note (Signed)
Addended by: Delrae Rend on: 07/09/2020 03:42 PM   Modules accepted: Orders

## 2020-07-09 NOTE — Addendum Note (Signed)
Addended by: Delrae Rend on: 07/09/2020 06:22 PM   Modules accepted: Orders

## 2020-07-09 NOTE — Addendum Note (Signed)
Addended by: Delrae Rend on: 07/09/2020 03:31 PM   Modules accepted: Orders

## 2020-07-09 NOTE — Progress Notes (Signed)
Jay Mitchell    [id number]    1956/04/23  Primary Care Physician:Burns, Bobette Mo, MD  Referring Physician: Pincus Sanes, MD [address] [place],  [place] [address]  Chief complaint:  Follow-up for  Spontaneous pneumothorax COPD BOOP> resolved Reccurent pnuemonia  HPI: Patient is a 64 year old male with reported history of COPD, remote history of tobacco use, and GE reflux disease who was hospitalized in early  nov 2017 when he developed sudden onset of left-sided chest pain and shortness of breath. At the time he was traveling in the [place] part of [place] for business where he was attending a conference. He was evaluated in the emergency department there and found to have a large left pneumothorax with complete collapse of the left lung, due to ruptured bleb. A small bore chest tube was placed but the lung only partially reexpanded. Subsequently the initial chest tube was removed and the larger chest tube placed by a surgeon. The patient was noted to have persistent air leak, and at the patient's request he was transferred to [facility] on 09/16/16  for further management. Cardiothoracic surgery was consulted for evaluation of left sided pneumothorax and subsequently underwent VATS with multiple blebectomy, apical pleurectomy, and mechanical pleural abrasion on 09/17/16. He is a former smoker who quit in 2004. 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  He has a COPD flare and left-sided pneumonia in October 2018.  Initially treated with doxycycline which he did not tolerate and completed a course of azithromycin and Levaquin with improvement in symptoms.    Interim history: Has stable dyspnea on exertion.  Denies any cough, sputum production Continues on Anoro inhaler.  He has lost her sense of taste and smell and wonders if he had the Covid infection in the past.  He is requesting IgG  testing He is unhappy with his DME company with [employer] and wants to change providers.  Outpatient Encounter Medications as of 07/09/2020  Medication Sig  . albuterol (VENTOLIN HFA) 108 (90 Base) MCG/ACT inhaler Inhale 2 puffs into the lungs every 6 (six) hours as needed for wheezing or shortness of breath.  . cetirizine (ZYRTEC) 10 MG tablet Take 10 mg by mouth daily.  . diphenhydramine-acetaminophen (TYLENOL PM) 25-500 MG TABS tablet Take 1 tablet by mouth at bedtime as needed.  Marland Kitchen esomeprazole (NEXIUM) 20 MG capsule Take 20 mg by mouth daily.   . fluticasone (FLONASE) 50 MCG/ACT nasal spray SPRAY 1 OR 2 TIMES INTO EACH NOSTRIL TWICE A DAY  . lansoprazole (PREVACID SOLUTAB) 15 MG disintegrating tablet Take 15 mg by mouth daily at 12 noon.  . Multiple Vitamins-Minerals (MULTIVITAMIN PO) Take 1 tablet by mouth daily.  . OXYGEN 2lpm with sleep only  . phenylephrine (SUDAFED PE) 10 MG TABS tablet Take 10 mg by mouth every 4 (four) hours as needed.  . umeclidinium-vilanterol (ANORO ELLIPTA) 62.5-25 MCG/INH AEPB INHALE 1 PUFF BY MOUTH EVERY DAY   No facility-administered encounter medications on file as of 07/09/2020.  Marland Kitchen  Physical Exam: Blood pressure 140/90, pulse (!) 108, temperature (!) 97.2 F (36.2 C), temperature source Temporal, height 6\' 1"  (1.854 m), weight 202 lb (91.6 kg), SpO2 98 %. Gen:      No acute distress HEENT:  EOMI, sclera anicteric Neck:     No masses; no thyromegaly Lungs:    Clear to  auscultation bilaterally; normal respiratory effort CV:         Regular rate and rhythm; no murmurs Abd:      + bowel sounds; soft, non-tender; no palpable masses, no distension Ext:    No edema; adequate peripheral perfusion Skin:      Warm and dry; no rash Neuro: alert and oriented x 3 Psych: normal mood and affect  Data Reviewed: Surgical pathology 09/17/16- lung parenchyma with bullae and lung fibrosis,  bronchiolitis obliterans organizing pneumonia.   CT chest without contrast  09/16/16- Severe bullous emphysema. Pneumothrax, B/L LL consolidations R > L.  High-resolution CT of the chest 11/25/16-  no evidence of interstitial lung disease, postsurgical scarring. Several subcentimeter scattered pulmonary lymph nodes. Moderate-severe emphysema. Chest x-ray 08/03/17-left lung infiltrate with multilobar pneumonia, postsurgical changes in the left lung Chest x-ray 08/27/17-improving left lung infiltrate Chest x-ray 09/16/17- stable left lung apical opacity Chest x-ray 10/16/17-clearing of left upper lobe infiltrate.  Stable scarring, postoperative changes on the left. CT chest 04/16/2018- advanced emphysematous changes.  Stable lung nodules. CT chest 05/23/2019- stable findings of emphysema and lung nodules I reviewed the images personally.  PFTs 12/10/16 FVC 2.90 (76%), FEV1 2.55 [66%], F/F 65, TLC 81%, DLCO 44% Moderate obstructive airway disease, severe diffusion defect.  Immune serologies 10/06/16 ANA positive, ENA RNP positive  A1 AT 12/12/16- 147, PIMM phenotype. CBC 09/16/17-absolute eosinophil count 88  Assessment:  Severe bullous emphysema COPD Ex smoker Stable on anoro.  Avoiding inhaled steroids as he developed pneumonia in 2018 Get routine chest x-ray  Spontaneous tension pneumothorax s/p VATS, blebectomy, apical pleurectomy, mechanical pleural abrasion- Nov 2017 BOOP and fibrosis on lung pathology > resolved His lung pathology shows Boop and fibrosis secondary to acute inflammation, chest tube placement during his admission. A follow-up CT scan has shown resolution of any interstitial opacities. An autoimmune workup shows elevation in ANA and ENA which is likely nonspecific.  Lung nodules Has remained stable since 2017.  No further follow-up required  Health maintenance 09/16/2017-Pneumovax Flu vaccine today  Plan/Recommendations: - Continue Anoro - Flu vaccine - Change DME companies - Chest x-ray, Covid IgG  Chilton Greathouse MD [facility] 07/09/2020, 3:00 PM  CC: Pincus Sanes, MD

## 2020-07-09 NOTE — Patient Instructions (Signed)
I am glad you are doing well with regard to your breathing  We will continue the Anoro and call in refills We will change your DME company from [employer] Flu vaccine today We will also get Covid IgG today and a chest x-ray  Follow-up in 1 year.

## 2020-07-10 LAB — SARS-COV-2 ANTIBODY(IGG)SPIKE,SEMI-QUANTITATIVE: SARS COV1 AB(IGG)SPIKE,SEMI QN: 11.46 index — ABNORMAL HIGH (ref <1.00)

## 2020-07-11 NOTE — Addendum Note (Signed)
Addended by: Delrae Rend on: 07/11/2020 01:28 PM   Modules accepted: Orders

## 2020-07-12 ENCOUNTER — Telehealth: Payer: Self-pay | Admitting: Pulmonary Disease

## 2020-07-12 DIAGNOSIS — G4734 Idiopathic sleep related nonobstructive alveolar hypoventilation: Secondary | ICD-10-CM

## 2020-07-12 NOTE — Telephone Encounter (Signed)
Called spoke with patient. State [employer] is horrible and he would rather tell them to come pick up their oxygen and go without than use then moving forward.   I called Amanda at [employer] she states there isn't any testing done on patient to prove he needs HS oxygen.   Called patient back, he state he was discharged from Hospital with oxygen 24/7. He was able to get himself off daytime oxygen, but still needs it at night. He states he has never had an over night study. I explained to him what it was and he said yes please whatever we have to do to get [facility] to support his oxygen needs.    Dr. Isaiah Serge may we please put in an order for ONO? Do you want on room air or what liter flow?

## 2020-07-13 NOTE — Telephone Encounter (Signed)
Ok to order ONO on room air. 

## 2020-07-13 NOTE — Telephone Encounter (Signed)
Spoke with pt, aware of recs.  ONO ordered.  Nothing further needed at this time- will close encounter.

## 2020-08-03 ENCOUNTER — Encounter: Payer: Self-pay | Admitting: Pulmonary Disease

## 2020-08-03 NOTE — Progress Notes (Signed)
Overnight oximetry on room air dated 08/02/2020 Duration of study 8 hours 11 minutes  Time spent less than 88% 301 minutes Nadir O2 sat of 74% Oxygen desaturation index 49  Patient qualifies for oxygen.  Please send overnight oximetry to DME to ensure he gets his oxygen ordered from [facility].  Please check with the patient, his oxygen he is on and sending the same prescription. Copy of report given to [facility] and scanned  Chilton Greathouse MD [facility] 08/03/2020, 5:02 PM

## 2020-08-08 ENCOUNTER — Telehealth: Payer: Self-pay | Admitting: Pulmonary Disease

## 2020-08-08 ENCOUNTER — Ambulatory Visit: Payer: Medicare Other

## 2020-08-08 ENCOUNTER — Other Ambulatory Visit: Payer: Self-pay

## 2020-08-08 DIAGNOSIS — J449 Chronic obstructive pulmonary disease, unspecified: Secondary | ICD-10-CM

## 2020-08-08 NOTE — Telephone Encounter (Signed)
Yes. Continue the same till we see him in clinic again.

## 2020-08-08 NOTE — Telephone Encounter (Signed)
Antibiotic was augmentin  Called let patient know Dr. Shirlee More recommendations.  Patient states he was also given prednisone 20mg  BID for 5 days so you just want him to take it until he comes into office?   Dr. please advise.

## 2020-08-08 NOTE — Telephone Encounter (Signed)
Please check what antibiotic he is already received Called in prednisone 40 mg a day for 5 days Agree with calling in portable oxygen concentrator's so that he can come into the clinic on 10/1 Please order chest x-ray to be done before the clinic appointment.  If he worsens in the interim then he may need to go to the ED

## 2020-08-08 NOTE — Telephone Encounter (Signed)
Called spoke with patient.  He states he needs to come in for appointment but he doesn't have any oxygen to travel with to get to Appointment. Order placed for DME to deliver portable tanks so patient can get to appointment with Dr. Isaiah Serge on 08/10/20 at 1:30.  Will send to APP of the day to clarify this is what needs to be done or if anything further needs to be done. Patient states when sitting still is oxygen with 3L is 94% without oxygen and getting up to walk in house it drops to 80% with oxygen 3L and up moving patient states it drops to 85%.   Dr. Isaiah Serge please advise

## 2020-08-09 NOTE — Telephone Encounter (Signed)
Spoke with patient.

## 2020-08-09 NOTE — Telephone Encounter (Signed)
Spoke with patient regarding proir message. Patient stated he will take his last prednisone tablet tomorrow in the morning . Patient stated he is feeling much better on o2 3l.  Patient did ask if our office was able to put a order in for [facility] for patient to have portable tank of o2 sent to his house for patient's upcomming appt. Advised patient order was out in and should hear something today . Advised patient if not patient can call our office back this afternoon and our office can call [facility]. Patient's voice was understanding. Nothing else further needed.

## 2020-08-09 NOTE — Telephone Encounter (Signed)
ATC patient left a voicemail for patient to call our office back .  

## 2020-08-09 NOTE — Telephone Encounter (Signed)
Pt returning call.  [phone]

## 2020-08-10 ENCOUNTER — Ambulatory Visit (INDEPENDENT_AMBULATORY_CARE_PROVIDER_SITE_OTHER): Payer: Medicare Other

## 2020-08-10 ENCOUNTER — Ambulatory Visit (INDEPENDENT_AMBULATORY_CARE_PROVIDER_SITE_OTHER): Payer: Medicare Other | Admitting: Pulmonary Disease

## 2020-08-10 ENCOUNTER — Encounter: Payer: Self-pay | Admitting: Pulmonary Disease

## 2020-08-10 ENCOUNTER — Emergency Department (HOSPITAL_COMMUNITY): Payer: Medicare Other

## 2020-08-10 ENCOUNTER — Inpatient Hospital Stay (HOSPITAL_COMMUNITY)
Admission: EM | Admit: 2020-08-10 | Discharge: 2020-08-13 | DRG: 201 | Disposition: A | Discharge status: Home / Self Care | Payer: Medicare Other | Attending: Thoracic Surgery (Cardiothoracic Vascular Surgery) | Admitting: Thoracic Surgery (Cardiothoracic Vascular Surgery)

## 2020-08-10 ENCOUNTER — Other Ambulatory Visit: Payer: Self-pay

## 2020-08-10 ENCOUNTER — Encounter (HOSPITAL_COMMUNITY): Payer: Self-pay

## 2020-08-10 VITALS — BP 130/70 | HR 109 | Temp 97.8°F | Ht 73.0 in | Wt 199.2 lb

## 2020-08-10 DIAGNOSIS — Z888 Allergy status to other drugs, medicaments and biological substances status: Secondary | ICD-10-CM | POA: Diagnosis not present

## 2020-08-10 DIAGNOSIS — J939 Pneumothorax, unspecified: Secondary | ICD-10-CM

## 2020-08-10 DIAGNOSIS — K219 Gastro-esophageal reflux disease without esophagitis: Secondary | ICD-10-CM | POA: Diagnosis present

## 2020-08-10 DIAGNOSIS — Z20822 Contact with and (suspected) exposure to covid-19: Secondary | ICD-10-CM | POA: Diagnosis present

## 2020-08-10 DIAGNOSIS — J9383 Other pneumothorax: Secondary | ICD-10-CM

## 2020-08-10 DIAGNOSIS — J449 Chronic obstructive pulmonary disease, unspecified: Secondary | ICD-10-CM

## 2020-08-10 DIAGNOSIS — Z8249 Family history of ischemic heart disease and other diseases of the circulatory system: Secondary | ICD-10-CM | POA: Diagnosis not present

## 2020-08-10 DIAGNOSIS — I10 Essential (primary) hypertension: Secondary | ICD-10-CM | POA: Diagnosis present

## 2020-08-10 DIAGNOSIS — Z881 Allergy status to other antibiotic agents status: Secondary | ICD-10-CM | POA: Diagnosis not present

## 2020-08-10 DIAGNOSIS — Z87891 Personal history of nicotine dependence: Secondary | ICD-10-CM

## 2020-08-10 DIAGNOSIS — E7800 Pure hypercholesterolemia, unspecified: Secondary | ICD-10-CM | POA: Diagnosis present

## 2020-08-10 DIAGNOSIS — J930 Spontaneous tension pneumothorax: Principal | ICD-10-CM | POA: Diagnosis present

## 2020-08-10 DIAGNOSIS — D72829 Elevated white blood cell count, unspecified: Secondary | ICD-10-CM | POA: Diagnosis present

## 2020-08-10 DIAGNOSIS — Z83438 Family history of other disorder of lipoprotein metabolism and other lipidemia: Secondary | ICD-10-CM

## 2020-08-10 DIAGNOSIS — Z4682 Encounter for fitting and adjustment of non-vascular catheter: Secondary | ICD-10-CM

## 2020-08-10 DIAGNOSIS — Z79899 Other long term (current) drug therapy: Secondary | ICD-10-CM

## 2020-08-10 DIAGNOSIS — J439 Emphysema, unspecified: Secondary | ICD-10-CM | POA: Diagnosis present

## 2020-08-10 DIAGNOSIS — Z9981 Dependence on supplemental oxygen: Secondary | ICD-10-CM

## 2020-08-10 LAB — BASIC METABOLIC PANEL
Anion gap: 11 (ref 5–15)
BUN: 14 mg/dL (ref 8–23)
CO2: 23 mmol/L (ref 22–32)
Calcium: 9 mg/dL (ref 8.9–10.3)
Chloride: 104 mmol/L (ref 98–111)
Creatinine, Ser: 0.91 mg/dL (ref 0.61–1.24)
GFR calc Af Amer: >60 mL/min (ref >60)
GFR calc non Af Amer: >60 mL/min (ref >60)
Glucose, Bld: 121 mg/dL — ABNORMAL HIGH (ref 70–99)
Potassium: 4 mmol/L (ref 3.5–5.1)
Sodium: 138 mmol/L (ref 135–145)

## 2020-08-10 LAB — CBC WITH DIFFERENTIAL/PLATELET
Abs Immature Granulocytes: 0.1 10*3/uL — ABNORMAL HIGH (ref 0.00–0.07)
Basophils Absolute: 0 10*3/uL (ref 0.0–0.1)
Basophils Relative: 0 %
Eosinophils Absolute: 0 10*3/uL (ref 0.0–0.5)
Eosinophils Relative: 0 %
HCT: 43.5 % (ref 39.0–52.0)
Hemoglobin: 14.7 g/dL (ref 13.0–17.0)
Immature Granulocytes: 1 %
Lymphocytes Relative: 10 %
Lymphs Abs: 1.4 10*3/uL (ref 0.7–4.0)
MCH: 30.2 pg (ref 26.0–34.0)
MCHC: 33.8 g/dL (ref 30.0–36.0)
MCV: 89.5 fL (ref 80.0–100.0)
Monocytes Absolute: 1.1 10*3/uL — ABNORMAL HIGH (ref 0.1–1.0)
Monocytes Relative: 8 %
Neutro Abs: 11.9 10*3/uL — ABNORMAL HIGH (ref 1.7–7.7)
Neutrophils Relative %: 81 %
Platelets: 366 10*3/uL (ref 150–400)
RBC: 4.86 MIL/uL (ref 4.22–5.81)
RDW: 12.5 % (ref 11.5–15.5)
WBC: 14.5 10*3/uL — ABNORMAL HIGH (ref 4.0–10.5)
nRBC: 0 % (ref 0.0–0.2)

## 2020-08-10 MED ORDER — OXYCODONE HCL 5 MG PO TABS
5.0000 mg | ORAL_TABLET | ORAL | Status: DC | PRN
Start: 2020-08-10 — End: 2020-08-13
  Administered 2020-08-10 – 2020-08-11 (×5): 5 mg via ORAL
  Filled 2020-08-10 (×5): qty 1

## 2020-08-10 MED ORDER — LORATADINE 10 MG PO TABS
10.0000 mg | ORAL_TABLET | Freq: Every day | ORAL | Status: DC
  Administered 2020-08-11 – 2020-08-13 (×3): 10 mg via ORAL
  Filled 2020-08-10 (×3): qty 1

## 2020-08-10 MED ORDER — ENOXAPARIN SODIUM 40 MG/0.4ML ~~LOC~~ SOLN
40.0000 mg | SUBCUTANEOUS | Status: DC
  Administered 2020-08-10 – 2020-08-12 (×3): 40 mg via SUBCUTANEOUS
  Filled 2020-08-10 (×3): qty 0.4

## 2020-08-10 MED ORDER — UMECLIDINIUM-VILANTEROL 62.5-25 MCG/INH IN AEPB
1.0000 | INHALATION_SPRAY | Freq: Every day | RESPIRATORY_TRACT | Status: DC
  Administered 2020-08-11 – 2020-08-13 (×3): 1 via RESPIRATORY_TRACT
  Filled 2020-08-10: qty 14

## 2020-08-10 MED ORDER — PANTOPRAZOLE SODIUM 40 MG PO TBEC
40.0000 mg | DELAYED_RELEASE_TABLET | Freq: Every day | ORAL | Status: DC
  Administered 2020-08-11 – 2020-08-13 (×3): 40 mg via ORAL
  Filled 2020-08-10 (×3): qty 1

## 2020-08-10 MED ORDER — ALBUTEROL SULFATE HFA 108 (90 BASE) MCG/ACT IN AERS
2.0000 | INHALATION_SPRAY | Freq: Four times a day (QID) | RESPIRATORY_TRACT | Status: DC | PRN
  Filled 2020-08-10: qty 6.7

## 2020-08-10 MED ORDER — ACETAMINOPHEN 500 MG PO TABS
1000.0000 mg | ORAL_TABLET | Freq: Four times a day (QID) | ORAL | Status: DC
  Administered 2020-08-10 – 2020-08-13 (×10): 1000 mg via ORAL
  Filled 2020-08-10 (×11): qty 2

## 2020-08-10 MED ORDER — MORPHINE SULFATE (PF) 2 MG/ML IV SOLN: 2.0000 mg | INTRAVENOUS | Status: DC | PRN

## 2020-08-10 MED ORDER — LIDOCAINE-EPINEPHRINE 1 %-1:100000 IJ SOLN
10.0000 mL | Freq: Once | INTRAMUSCULAR | Status: AC
  Administered 2020-08-10: 10 mL

## 2020-08-10 NOTE — Procedures (Signed)
Mr. Nikolai presented with a 5 day history of shortness of breath. CXR shows moderate right pneumothorax. Recommended chest tube placement. I informed him of the indications, risks, benefits and alternatives. He accepts the risks and agrees to proceed.  Sterile technique. Local anesthesia with 10 ml of 1% lidocaine.  14 F pigtail catheter placed right pleural space. Tolerated well. No rush of air but + air leak with cough.  CXR shows tube in good position.  Salvatore Decent Dorris Fetch, MD [facility] [phone]

## 2020-08-10 NOTE — H&P (Signed)
Jay Mitchell is an 64 y.o. male.   Chief Complaint: shortness of breath HPI: 64 yo man with a history of tobacco abuse, COPD, left pneumothorax and reflux presents with a 5 day history of shortness of breath. Had a left pneumothorax in 2017. Ultimately required a VATS for bleb resection. Was in his usual state of health until 5 days ago when he had rather sudden onset of dyspnea, but no chest pain. He did have some discomfort in his left upper back. He had a tele visit and was prescribed antibiotics and steroids but did not have any improvement. Today went to see Dr. Joneen Caraway. A CXR showed a moderate right pneumothorax. He was sent to ED.  Currently NAD but is still short of breath.   Quit smoking in 2004.   Past Medical History:  Diagnosis Date  . COPD (chronic obstructive pulmonary disease) (HCC)   . GERD (gastroesophageal reflux disease)   . Spontaneous pneumothorax 09/15/2016   left    Past Surgical History:  Procedure Laterality Date  . VIDEO ASSISTED THORACOSCOPY Left 09/17/2016   Procedure: VIDEO ASSISTED THORACOSCOPY multiple/BLEBECTOMY, apical pleurectomy, mechanical pleural abrasion;  Surgeon: Loreli Slot, MD;  Location: [facility] OR;  Service: Thoracic;  Laterality: Left;    Family History  Problem Relation Age of Onset  . Osteoporosis Mother   . Hypertension Sister   . Thyroid disease Sister   . Hyperlipidemia Sister   . Anxiety disorder Sister    Social History:  reports that he quit smoking about 17 years ago. His smoking use included cigarettes. He has a 37.50 pack-year smoking history. He has never used smokeless tobacco. He reports that he does not drink alcohol and does not use drugs.  Allergies:  Allergies  Allergen Reactions  . Fentanyl     Stopped breathing  . Doxycycline Nausea And Vomiting  . Meperidine Hcl Nausea Only    (Not in a hospital admission)   Results for orders placed or performed during the hospital encounter of 08/10/20 (from the past 48  hour(s))  CBC with Differential/Platelet     Status: Abnormal   Collection Time: 08/10/20  3:07 PM  Result Value Ref Range   WBC 14.5 (H) 4.0 - 10.5 K/uL   RBC 4.86 4.22 - 5.81 MIL/uL   Hemoglobin 14.7 13.0 - 17.0 g/dL   HCT 70.2 39 - 52 %   MCV 89.5 80.0 - 100.0 fL   MCH 30.2 26.0 - 34.0 pg   MCHC 33.8 30.0 - 36.0 g/dL   RDW 63.7 85.8 - 85.0 %   Platelets 366 150 - 400 K/uL   nRBC 0.0 0.0 - 0.2 %   Neutrophils Relative % 81 %   Neutro Abs 11.9 (H) 1.7 - 7.7 K/uL   Lymphocytes Relative 10 %   Lymphs Abs 1.4 0.7 - 4.0 K/uL   Monocytes Relative 8 %   Monocytes Absolute 1.1 (H) 0 - 1 K/uL   Eosinophils Relative 0 %   Eosinophils Absolute 0.0 0 - 0 K/uL   Basophils Relative 0 %   Basophils Absolute 0.0 0 - 0 K/uL   Immature Granulocytes 1 %   Abs Immature Granulocytes 0.10 (H) 0.00 - 0.07 K/uL    Comment: Performed at [facility], [address] [place], [place] [address]  Basic metabolic panel     Status: Abnormal   Collection Time: 08/10/20  3:07 PM  Result Value Ref Range   Sodium 138 135 - 145 mmol/L  Potassium 4.0 3.5 - 5.1 mmol/L   Chloride 104 98 - 111 mmol/L   CO2 23 22 - 32 mmol/L   Glucose, Bld 121 (H) 70 - 99 mg/dL    Comment: Glucose reference range applies only to samples taken after fasting for at least 8 hours.   BUN 14 8 - 23 mg/dL   Creatinine, Ser 8.78 0.61 - 1.24 mg/dL   Calcium 9.0 8.9 - 67.6 mg/dL   GFR calc non Af Amer >60 >60 mL/min   GFR calc Af Amer >60 >60 mL/min   Anion gap 11 5 - 15    Comment: Performed at [facility], [address] [place], [place] [address]   DG Chest 2 View  Addendum Date: 08/10/2020   ADDENDUM REPORT: 08/10/2020 14:26 ADDENDUM: Critical Value/emergent results were called by telephone at the time of interpretation on 08/10/2020 at 2:24 pm to provider [facility] , who verbally acknowledged these results. Electronically Signed   By: Lupita Raider M.D.   On: 08/10/2020 14:26   Result Date:  08/10/2020 CLINICAL DATA:  Shortness of breath. EXAM: CHEST - 2 VIEW COMPARISON:  July 09, 2020. FINDINGS: The heart size and mediastinal contours are within normal limits. Interval development of moderate size right apical and basilar pneumothorax on the order of at least 50%. Left basilar subsegmental atelectasis is noted. The visualized skeletal structures are unremarkable. IMPRESSION: Interval development of moderate size right apical and basilar pneumothorax. Electronically Signed: By: Lupita Raider M.D. On: 08/10/2020 13:58   DG Chest 1V REPEAT Same Day  Result Date: 08/10/2020 CLINICAL DATA:  Shortness of breath for nearly 1 week. Patient diagnosed with a pneumothorax today. Status post chest tube placement. EXAM: CHEST - 1 VIEW SAME DAY COMPARISON:  PA and lateral chest earlier today. FINDINGS: A new pigtail catheter is in place in the right chest. Right pneumothorax seen on the prior study has almost completely resolved with only a small residual identified. Mild left basilar atelectasis is noted. No left pneumothorax. The lungs are markedly emphysematous. No pleural effusion. Heart size is normal. Atherosclerosis noted. No acute bony abnormality. IMPRESSION: Near complete re-expansion of the right lung after chest tube placement. No new abnormality. Aortic Atherosclerosis (ICD10-I70.0) and Emphysema (ICD10-J43.9). Electronically Signed   By: Drusilla Kanner M.D.   On: 08/10/2020 16:39  I personally reviewed the chest X ray images and concur with the findings noted above  Review of Systems  Constitutional: Positive for activity change. Negative for fatigue and fever.  HENT: Negative for trouble swallowing and voice change.   Respiratory: Positive for shortness of breath.   Cardiovascular: Negative for chest pain and leg swelling.  Gastrointestinal: Positive for abdominal pain (reflux).  Genitourinary: Negative for dysuria.  Musculoskeletal: Positive for arthralgias and joint swelling.   Neurological: Negative for seizures and weakness.    Blood pressure (!) 147/87, pulse 76, temperature 98.4 F (36.9 C), temperature source Oral, resp. rate 20, height 6\' 1"  (1.854 m), weight 90.4 kg, SpO2 96 %. Physical Exam Vitals reviewed.  Constitutional:      General: He is not in acute distress. HENT:     Head: Normocephalic and atraumatic.  Eyes:     Extraocular Movements: Extraocular movements intact.  Neck:     Trachea: No tracheal deviation.  Cardiovascular:     Rate and Rhythm: Normal rate and regular rhythm.     Heart sounds: No murmur heard.   Pulmonary:     Breath sounds: Examination  of the right-upper field reveals decreased breath sounds. Examination of the right-middle field reveals decreased breath sounds. Examination of the right-lower field reveals decreased breath sounds. Decreased breath sounds present. No wheezing, rhonchi or rales.  Chest:     Chest wall: No tenderness.  Abdominal:     General: Bowel sounds are normal.     Palpations: Abdomen is soft.  Musculoskeletal:     Cervical back: Neck supple.     Right lower leg: No edema.     Left lower leg: No edema.  Skin:    General: Skin is warm and dry.  Neurological:     General: No focal deficit present.     Mental Status: He is alert and oriented to person, place, and time.     Cranial Nerves: No cranial nerve deficit.     Motor: No weakness.     Assessment/Plan Mr. Leider is a 64 yo man with a history of tobacco abuse, severe bullous emphysema, prior left pneumothorax and GERD, who presents with a 5 day history of shortness of breath. CXR shows a moderate right sided spontaneous pneumothorax.   He needs a chest tube placed to reexpand the right lung. I discussed the indications, risks, benefits and alternatives with him. He agrees to proceed  O2 Bronchodilators Pain control Chest tube to water seal Enoxaparin for DVT prophylaxis   Loreli Slot, MD 08/10/2020, 4:53 PM

## 2020-08-10 NOTE — Progress Notes (Addendum)
Jay Mitchell    [id number]    07-Dec-1955  Primary Care Physician:Burns, Bobette Mo, MD  Referring Physician: Pincus Sanes, MD [address] [place],  [place] [address]  Chief complaint:  Follow-up for  Spontaneous pneumothorax COPD BOOP> resolved Reccurent pnuemonia  HPI: Patient is a 64 year old male with reported history of COPD, remote history of tobacco use, and GE reflux disease who was hospitalized in early  nov 2017 when he developed sudden onset of left-sided chest pain and shortness of breath. At the time he was traveling in the [place] part of [place] for business where he was attending a conference. He was evaluated in the emergency department there and found to have a large left pneumothorax with complete collapse of the left lung, due to ruptured bleb. A small bore chest tube was placed but the lung only partially reexpanded. Subsequently the initial chest tube was removed and the larger chest tube placed by a surgeon. The patient was noted to have persistent air leak, and at the patient's request he was transferred to [facility] on 09/16/16  for further management. Cardiothoracic surgery was consulted for evaluation of left sided pneumothorax and subsequently underwent VATS with multiple blebectomy, apical pleurectomy, and mechanical pleural abrasion on 09/17/16. He is a former smoker who quit in 2004. 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  He has a COPD flare and left-sided pneumonia in October 2018.  Initially treated with doxycycline which he did not tolerate and completed a course of azithromycin and Levaquin with improvement in symptoms.    Interim history: Complains of sudden onset of dyspnea 1 week ago on Saturday.  He got steroids and antibiotics from telemedicine without any improvement Has mild left chest pain, difficulty taking deep inspiration.  Outpatient Encounter  Medications as of 08/10/2020  Medication Sig  . albuterol (VENTOLIN HFA) 108 (90 Base) MCG/ACT inhaler Inhale 2 puffs into the lungs every 6 (six) hours as needed for wheezing or shortness of breath.  . cetirizine (ZYRTEC) 10 MG tablet Take 10 mg by mouth daily.  . diphenhydramine-acetaminophen (TYLENOL PM) 25-500 MG TABS tablet Take 1 tablet by mouth at bedtime as needed.  Marland Kitchen esomeprazole (NEXIUM) 20 MG capsule Take 20 mg by mouth daily.   . fluticasone (FLONASE) 50 MCG/ACT nasal spray SPRAY 1 OR 2 TIMES INTO EACH NOSTRIL TWICE A DAY  . lansoprazole (PREVACID SOLUTAB) 15 MG disintegrating tablet Take 15 mg by mouth daily at 12 noon.  . Multiple Vitamins-Minerals (MULTIVITAMIN PO) Take 1 tablet by mouth daily.  . OXYGEN 2lpm with sleep only  . phenylephrine (SUDAFED PE) 10 MG TABS tablet Take 10 mg by mouth every 4 (four) hours as needed.  . umeclidinium-vilanterol (ANORO ELLIPTA) 62.5-25 MCG/INH AEPB INHALE 1 PUFF BY MOUTH EVERY DAY   No facility-administered encounter medications on file as of 08/10/2020.  Marland Kitchen  Physical Exam: Blood pressure 130/70, pulse (!) 109, temperature 97.8 F (36.6 C), temperature source Oral, height 6\' 1"  (1.854 m), weight 199 lb 3.2 oz (90.4 kg), SpO2 94 %. Gen:      No acute distress HEENT:  EOMI, sclera anicteric Neck:     No masses; no thyromegaly Lungs:    Diminished breath sounds on the right CV:         Regular rate and rhythm; no murmurs Abd:      +  bowel sounds; soft, non-tender; no palpable masses, no distension Ext:    No edema; adequate peripheral perfusion Skin:      Warm and dry; no rash Neuro: alert and oriented x 3 Psych: normal mood and affect  Data Reviewed: Surgical pathology 09/17/16- lung parenchyma with bullae and lung fibrosis,  bronchiolitis obliterans organizing pneumonia.   CT chest without contrast 09/16/16- Severe bullous emphysema. Pneumothrax, B/L LL consolidations R > L.  High-resolution CT of the chest 11/25/16-  no evidence of  interstitial lung disease, postsurgical scarring. Several subcentimeter scattered pulmonary lymph nodes. Moderate-severe emphysema. Chest x-ray 08/03/17-left lung infiltrate with multilobar pneumonia, postsurgical changes in the left lung Chest x-ray 08/27/17-improving left lung infiltrate Chest x-ray 09/16/17- stable left lung apical opacity Chest x-ray 10/16/17-clearing of left upper lobe infiltrate.  Stable scarring, postoperative changes on the left. CT chest 04/16/2018- advanced emphysematous changes.  Stable lung nodules. CT chest 05/23/2019- stable findings of emphysema and lung nodules Chest x-ray 08/10/2020-moderate pneumothorax on the right I reviewed the images personally.  PFTs 12/10/16 FVC 2.90 (76%), FEV1 2.55 [66%], F/F 65, TLC 81%, DLCO 44% Moderate obstructive airway disease, severe diffusion defect.  Immune serologies 10/06/16 ANA positive, ENA RNP positive  A1 AT 12/12/16- 147, PIMM phenotype. CBC 09/16/17-absolute eosinophil count 88  Assessment:  Recurrent pneumothorax He has history of Spontaneous tension pneumothorax on the left s/p VATS, blebectomy, apical pleurectomy, mechanical pleural abrasion- Nov 2017 Now with moderate pneumothorax on the right I had a sending him to the ED and called the triage nurse at [facility], ED and told him to expect him His sats are stable on 3 L and will be driven by his wife.  He does not want transportation by EMS.  Severe bullous emphysema COPD Ex smoker Stable on anoro.  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 A follow-up CT scan has shown resolution of any interstitial opacities. An autoimmune workup shows elevation in ANA and ENA which is likely nonspecific.  Lung nodules Has remained stable since 2017.  No further follow-up  required  Health maintenance 09/16/2017-Pneumovax Up-to-date with flu and COVID-19 vaccination  Plan/Recommendations: -Sent to ER for management of recurrent pneumothorax  Chilton Greathouse MD [facility] 08/10/2020, 2:02 PM  CC: Jay Sanes, MD

## 2020-08-10 NOTE — ED Triage Notes (Signed)
Pt sent by pulmonology office for further treatment of right sided pneumothorax that resulted on xray today. Pt has had SOB since Saturday, had a telehealth visit and given medications without relief. Pt on 3L [place] from home, no resp distress noted. Hx of left sided pneumothorax a few years ago.

## 2020-08-10 NOTE — ED Provider Notes (Signed)
[facility] EMERGENCY DEPARTMENT Provider Note   CSN: [id number] Arrival date & time: 08/10/20  1428     History Chief Complaint  Patient presents with  . Shortness of Breath    Jay Mitchell is a 64 y.o. male.  HPI   This patient is a 64 year old male, history of COPD, history of spontaneous pneumothorax in the past, history of being treated by cardiothoracic surgery at this hospital in 2017 at which time he underwent a VATS procedure with multiple blebectomy, apical pleurectomy and mechanical pleural abrasion in November 2017.  He has not smoked since 2004.  The patient reports that approximately 7 to 10 days ago he started to develop some mild increase shortness of breath, there was occasional coughing, taking over-the-counter medications, did not feel much better.  He did a televisit and received a prescription for an antibiotic and prednisone which she has finished, he did not get any better so ultimately followed up with his pulmonologist.  An x-ray was ordered for today and he was found to have a right sided moderate sized pneumothorax.  There is no mediastinal shift and the patient was in no distress, he was sent to the hospital emergently by Pulmonologist Dr. Isaiah Serge, he elected to come by private vehicle and declined ambulance transport.  He is on supplemental oxygen at 3 L on maintaining an adequate oxygentation.  The patient has a chest discomfort across his chest and mostly on the left side.  He has not had any swelling of his legs, not had any fevers chills nausea or vomiting, not had any other exposures to ill people.  His symptoms have been persistent over the week but gradually worsening  Past Medical History:  Diagnosis Date  . COPD (chronic obstructive pulmonary disease) (HCC)   . GERD (gastroesophageal reflux disease)   . Spontaneous pneumothorax 09/15/2016   left    Patient Active Problem List   Diagnosis Date Noted  . Hyperglycemia 02/15/2018  .  PNA (pneumonia) 08/17/2017  . Acute bronchitis 08/03/2017  . Rhinitis, chronic 05/12/2017  . Lung blebs (HCC) 09/17/2016  . Spontaneous tension pneumothorax   . GERD (gastroesophageal reflux disease)   . COPD with chronic bronchitis and emphysema (HCC)   . HYPERCHOLESTEROLEMIA 08/31/2007    Past Surgical History:  Procedure Laterality Date  . VIDEO ASSISTED THORACOSCOPY Left 09/17/2016   Procedure: VIDEO ASSISTED THORACOSCOPY multiple/BLEBECTOMY, apical pleurectomy, mechanical pleural abrasion;  Surgeon: Loreli Slot, MD;  Location: [facility] OR;  Service: Thoracic;  Laterality: Left;       Family History  Problem Relation Age of Onset  . Osteoporosis Mother   . Hypertension Sister   . Thyroid disease Sister   . Hyperlipidemia Sister   . Anxiety disorder Sister     Social History   Tobacco Use  . Smoking status: Former Smoker    Packs/day: 1.50    Years: 25.00    Pack years: 37.50    Types: Cigarettes    Quit date: 2004    Years since quitting: 17.7  . Smokeless tobacco: Never Used  Substance Use Topics  . Alcohol use: No  . Drug use: No    Home Medications Prior to Admission medications   Medication Sig Start Date End Date Taking? Authorizing Provider  albuterol (VENTOLIN HFA) 108 (90 Base) MCG/ACT inhaler Inhale 2 puffs into the lungs every 6 (six) hours as needed for wheezing or shortness of breath. 05/30/19   Mannam, Praveen, MD  cetirizine (ZYRTEC) 10 MG  tablet Take 10 mg by mouth daily.    [provider]  diphenhydramine-acetaminophen (TYLENOL PM) 25-500 MG TABS tablet Take 1 tablet by mouth at bedtime as needed.    [provider]  esomeprazole (NEXIUM) 20 MG capsule Take 20 mg by mouth daily.     [provider]  fluticasone (FLONASE) 50 MCG/ACT nasal spray SPRAY 1 OR 2 TIMES INTO EACH NOSTRIL TWICE A DAY 02/24/18   Nyoka CowdenWert, Michael B, MD  lansoprazole (PREVACID SOLUTAB) 15 MG disintegrating tablet Take 15 mg by mouth daily at 12 noon.     [provider]  Multiple Vitamins-Minerals (MULTIVITAMIN PO) Take 1 tablet by mouth daily.    [provider]  OXYGEN 2lpm with sleep only    [provider]  phenylephrine (SUDAFED PE) 10 MG TABS tablet Take 10 mg by mouth every 4 (four) hours as needed.    [provider]  umeclidinium-vilanterol (ANORO ELLIPTA) 62.5-25 MCG/INH AEPB INHALE 1 PUFF BY MOUTH EVERY DAY 07/09/20   Mannam, Praveen, MD    Allergies    Fentanyl, Doxycycline, and Meperidine hcl  Review of Systems   Review of Systems  All other systems reviewed and are negative.   Physical Exam Updated Vital Signs BP (!) 172/99   Pulse (!) 101   Temp 98.4 F (36.9 C) (Oral)   Resp 20   Ht 1.854 m (6\' 1" )   Wt 90.4 kg   SpO2 98%   BMI 26.28 kg/m   Physical Exam Vitals and nursing note reviewed.  Constitutional:      General: He is not in acute distress.    Appearance: He is well-developed.  HENT:     Head: Normocephalic and atraumatic.     Mouth/Throat:     Pharynx: No oropharyngeal exudate.  Eyes:     General: No scleral icterus.       Right eye: No discharge.        Left eye: No discharge.     Conjunctiva/sclera: Conjunctivae normal.     Pupils: Pupils are equal, round, and reactive to light.  Neck:     Thyroid: No thyromegaly.     Vascular: No JVD.     Comments: Trachea is midline Cardiovascular:     Rate and Rhythm: Regular rhythm. Tachycardia present.     Heart sounds: Normal heart sounds. No murmur heard.  No friction rub. No gallop.      Comments: Mild tachycardia, normal pulses at the radial arteries Pulmonary:     Breath sounds: Wheezing present. No rhonchi or rales.     Comments: The patient has mild tachypnea, no respiratory distress, subtle expiratory wheezing better heard on the left than the right, the breath sounds are significantly decreased on the right Abdominal:     General: Bowel sounds are normal. There is no distension.     Palpations:  Abdomen is soft. There is no mass.     Tenderness: There is no abdominal tenderness.  Musculoskeletal:        General: No tenderness. Normal range of motion.     Cervical back: Normal range of motion and neck supple.  Lymphadenopathy:     Cervical: No cervical adenopathy.  Skin:    General: Skin is warm and dry.     Findings: No erythema or rash.  Neurological:     Mental Status: He is alert.     Coordination: Coordination normal.  Psychiatric:        Behavior: Behavior normal.  ED Results / Procedures / Treatments   Labs (all labs ordered are listed, but only abnormal results are displayed) Labs Reviewed  CBC WITH DIFFERENTIAL/PLATELET - Abnormal; Notable for the following components:      Result Value   WBC 14.5 (*)    Neutro Abs 11.9 (*)    Monocytes Absolute 1.1 (*)    Abs Immature Granulocytes 0.10 (*)    All other components within normal limits  BASIC METABOLIC PANEL - Abnormal; Notable for the following components:   Glucose, Bld 121 (*)    All other components within normal limits    EKG EKG Interpretation  Date/Time:  Friday August 10 2020 14:39:42 EDT Ventricular Rate:  104 PR Interval:  140 QRS Duration: 82 QT Interval:  328 QTC Calculation: 431 R Axis:   12 Text Interpretation: Sinus tachycardia Otherwise normal ECG since last tracing no significant change Confirmed by Eber Hong ([id number]) on 08/10/2020 2:48:24 PM   Radiology DG Chest 2 View  Addendum Date: 08/10/2020   ADDENDUM REPORT: 08/10/2020 14:26 ADDENDUM: Critical Value/emergent results were called by telephone at the time of interpretation on 08/10/2020 at 2:24 pm to provider [facility] , who verbally acknowledged these results. Electronically Signed   By: Lupita Raider M.D.   On: 08/10/2020 14:26   Result Date: 08/10/2020 CLINICAL DATA:  Shortness of breath. EXAM: CHEST - 2 VIEW COMPARISON:  July 09, 2020. FINDINGS: The heart size and mediastinal contours are within normal limits.  Interval development of moderate size right apical and basilar pneumothorax on the order of at least 50%. Left basilar subsegmental atelectasis is noted. The visualized skeletal structures are unremarkable. IMPRESSION: Interval development of moderate size right apical and basilar pneumothorax. Electronically Signed: By: Lupita Raider M.D. On: 08/10/2020 13:58    Procedures .Critical Care Performed by: Eber Hong, MD Authorized by: Eber Hong, MD   Critical care provider statement:    Critical care time (minutes):  35   Critical care time was exclusive of:  Separately billable procedures and treating other patients and teaching time   Critical care was necessary to treat or prevent imminent or life-threatening deterioration of the following conditions:  Respiratory failure   Critical care was time spent personally by me on the following activities:  Blood draw for specimens, development of treatment plan with patient or surrogate, discussions with consultants, evaluation of patient's response to treatment, examination of patient, obtaining history from patient or surrogate, ordering and performing treatments and interventions, ordering and review of laboratory studies, ordering and review of radiographic studies, pulse oximetry, re-evaluation of patient's condition and review of old charts   (including critical care time)  Medications Ordered in ED Medications - No data to display  ED Course  I have reviewed the triage vital signs and the nursing notes.  Pertinent labs & imaging results that were available during my care of the patient were reviewed by me and considered in my medical decision making (see chart for details).  Clinical Course as of Aug 11 1603  Fri Aug 10, 2020  1600 Pt has a leukocytosis not surprisingly, BMP normal To be admitted   [BM]    Clinical Course User Index [BM] Eber Hong, MD   MDM Rules/Calculators/A&P                          I have  personally viewed the two-view chest x-ray which was performed today, PA and lateral views.  There does appear to be a right-sided pneumothorax, there is not appear to be any mediastinal shift, the left lung appears well-inflated.  This patient's EKG shows mild sinus tachycardia, otherwise unremarkable and nonischemic.  We will order labs, place an IV, the patient does describe a history of cardiac arrest after being given fentanyl around the time that he had his procedure.  We will avoid this medication.  I will discuss with cardiothoracic surgery prior to placing a chest tube as it does not need to be done emergently given the absence of a tension pneumothorax.  I discussed the case with  Dr. Dorris Fetch at 3:35 - he will come to assess the patient and place a chest tube, patient reassessed at 3:45 PM    Final Clinical Impression(s) / ED Diagnoses Final diagnoses:  Spontaneous pneumothorax  Leukocytosis, unspecified type  Chronic obstructive pulmonary disease, unspecified COPD type (HCC)      Eber Hong, MD 08/11/20 1844

## 2020-08-10 NOTE — Patient Instructions (Addendum)
I have reviewed your chest x-ray that shows a collapse of the right lung. Please have your wife drive you to [place]  Follow back in the clinic in 3 months.

## 2020-08-11 ENCOUNTER — Encounter (HOSPITAL_COMMUNITY): Payer: Self-pay | Admitting: Thoracic Surgery (Cardiothoracic Vascular Surgery)

## 2020-08-11 ENCOUNTER — Inpatient Hospital Stay (HOSPITAL_COMMUNITY): Payer: Medicare Other

## 2020-08-11 LAB — CBC
HCT: 40.9 % (ref 39.0–52.0)
Hemoglobin: 13.4 g/dL (ref 13.0–17.0)
MCH: 29.5 pg (ref 26.0–34.0)
MCHC: 32.8 g/dL (ref 30.0–36.0)
MCV: 90.1 fL (ref 80.0–100.0)
Platelets: 321 10*3/uL (ref 150–400)
RBC: 4.54 MIL/uL (ref 4.22–5.81)
RDW: 12.6 % (ref 11.5–15.5)
WBC: 16.1 10*3/uL — ABNORMAL HIGH (ref 4.0–10.5)
nRBC: 0 % (ref 0.0–0.2)

## 2020-08-11 LAB — RESPIRATORY PANEL BY RT PCR (FLU A&B, COVID)
Influenza A by PCR: NEGATIVE
Influenza B by PCR: NEGATIVE
SARS Coronavirus 2 by RT PCR: NEGATIVE

## 2020-08-11 NOTE — Progress Notes (Addendum)
      [address]       [place] [id number]             [phone]      Subjective:  Sitting up eating breakfast.  States he is doing okay.  Pain is controlled.  Denies N/V.  Objective: Vital signs in last 24 hours: Temp:  [97.3 F (36.3 C)-98.4 F (36.9 C)] 97.3 F (36.3 C) (10/02 0432) Pulse Rate:  [59-109] 65 (10/02 0432) Cardiac Rhythm: Normal sinus rhythm (10/02 0432) Resp:  [12-20] 12 (10/02 0432) BP: (130-172)/(70-99) 145/85 (10/02 0432) SpO2:  [94 %-98 %] 95 % (10/02 0432) Weight:  [86.6 kg-90.4 kg] 86.6 kg (10/01 1800)  Intake/Output from previous day: 10/01 0701 - 10/02 0700 In: 960 [P.O.:960] Out: 1075 [Urine:1075]  General appearance: alert, cooperative and no distress Heart: regular rate and rhythm Lungs: clear to auscultation bilaterally Abdomen: soft, non-tender; bowel sounds normal; no masses,  no organomegaly Wound: clean and dry  Lab Results: Recent Labs    08/10/20 1507 08/11/20 0153  WBC 14.5* 16.1*  HGB 14.7 13.4  HCT 43.5 40.9  PLT 366 321   BMET:  Recent Labs    08/10/20 1507  NA 138  K 4.0  CL 104  CO2 23  GLUCOSE 121*  BUN 14  CREATININE 0.91  CALCIUM 9.0    PT/INR: No results for input(s): LABPROT, INR in the last 72 hours. ABG    Component Value Date/Time   PHART 7.392 09/18/2016 0412   HCO3 26.4 09/18/2016 0412   TCO2 28 09/18/2016 0412   O2SAT 90.0 09/18/2016 0412   CBG (last 3)  No results for input(s): GLUCAP in the last 72 hours.  Assessment/Plan:  1. CV- NSR, + HTN- patient denies history- will monitor, if doesn't improve patient will need follow up with PCP 2. Pulm- CT output nothing, no air leak present, CXR shows minimal pneumothorax on the right 3. Lovenox for DVT for prophylaxis 4. Dispo- patient stable, leave chest tube to water seal, monitor BP, repeat CXR in AM   LOS: 1 day    Jay Dandy, PA-C 08/11/2020 Patient seen and examined agree with above Leave CT in place. There is  a tiny space apically, not clinically significant  Jay Spare C. Dorris Fetch, MD [facility] [phone]

## 2020-08-12 ENCOUNTER — Inpatient Hospital Stay (HOSPITAL_COMMUNITY): Payer: Medicare Other

## 2020-08-12 NOTE — Progress Notes (Addendum)
      [address]       [place] [address]             [phone]      Subjective:  No new complaints.  Continues to feel well.  He again states he has no history of high blood pressure and its always normal when he checks it at home.  Objective: Vital signs in last 24 hours: Temp:  [97.5 F (36.4 C)-98.4 F (36.9 C)] 98 F (36.7 C) (10/03 0716) Pulse Rate:  [77-90] 82 (10/03 0716) Cardiac Rhythm: Normal sinus rhythm (10/03 0758) Resp:  [12-20] 12 (10/03 0716) BP: (136-151)/(80-87) 151/84 (10/03 0716) SpO2:  [91 %-95 %] 95 % (10/03 0716)  Intake/Output from previous day: 10/02 0701 - 10/03 0700 In: 600 [P.O.:600] Out: 1010 [Urine:1000; Chest Tube:10]  General appearance: alert, cooperative and no distress Heart: regular rate and rhythm Lungs: clear to auscultation bilaterally Abdomen: soft, non-tender; bowel sounds normal; no masses,  no organomegaly Wound: clean and dry  Lab Results: Recent Labs    08/10/20 1507 08/11/20 0153  WBC 14.5* 16.1*  HGB 14.7 13.4  HCT 43.5 40.9  PLT 366 321   BMET:  Recent Labs    08/10/20 1507  NA 138  K 4.0  CL 104  CO2 23  GLUCOSE 121*  BUN 14  CREATININE 0.91  CALCIUM 9.0    PT/INR: No results for input(s): LABPROT, INR in the last 72 hours. ABG    Component Value Date/Time   PHART 7.392 09/18/2016 0412   HCO3 26.4 09/18/2016 0412   TCO2 28 09/18/2016 0412   O2SAT 90.0 09/18/2016 0412   CBG (last 3)  No results for input(s): GLUCAP in the last 72 hours.  Assessment/Plan:  1. CV- NSR, HTN persists- I suspect this is likely due to stress being in hospital and chest tube in place.. patient instructed to follow up with family doctor for BP check 2. Pulm- CXR shows further improvement of minimal apical pneumothorax, no air leak is present from chest tube, likely remove today... will verify with Dr. Dorris Fetch 3. Lovenox for DVT prophylaxis 4. Dispo- patient stable, CXR shows further improvement  of trace apical pneumothorax, no air leak, I suspect we will d/c chest tube today, HTN follow up with family doctor.. possibly for d/c later today vs tomorrow, will discuss with Dr. Dorris Fetch   LOS: 2 days    Lowella Dandy, PA-C 08/12/2020 Patient seen and examined, agree with above Dc chest tube Observe overnight after tube removed  Viviann Spare C. Dorris Fetch, MD [facility] [phone]

## 2020-08-12 NOTE — Progress Notes (Signed)
Right chest tube removed per MD order without difficulty.  Pt educated on 1 hour bedrest and vitals being frequently cycled.  Radiology called with regard to post CT removal chest x-ray.  Will continue to monitor.

## 2020-08-12 NOTE — Discharge Instructions (Signed)
Pneumothorax A pneumothorax is commonly called a collapsed lung. It is a condition in which air leaks from a lung and builds up between the thin layer of tissue that covers the lungs (visceral pleura) and the interior wall of the chest cavity (parietal pleura). The air gets trapped outside the lung, between the lung and the chest wall (pleural space). The air takes up space and prevents the lung from fully expanding. This condition sometimes occurs suddenly with no apparent cause. The buildup of air may be small or large. A small pneumothorax may go away on its own. A large pneumothorax will require treatment and hospitalization. What are the causes? This condition may be caused by:  Trauma and injury to the chest wall.  Surgery and other medical procedures.  A complication of an underlying lung problem, especially chronic obstructive pulmonary disease (COPD) or emphysema. Sometimes the cause of this condition is not known. What increases the risk? You are more likely to develop this condition if:  You have an underlying lung problem.  You smoke.  You are 20-40 years old, male, tall, and underweight.  You have a personal or family history of pneumothorax.  You have an eating disorder (anorexia nervosa). This condition can also happen quickly, even in people with no history of lung problems. What are the signs or symptoms? Sometimes a pneumothorax will have no symptoms. When symptoms are present, they can include:  Chest pain.  Shortness of breath.  Increased rate of breathing.  Bluish color to your lips or skin (cyanosis). How is this diagnosed? This condition may be diagnosed by:  A medical history and physical exam.  A chest X-ray, chest CT scan, or ultrasound. How is this treated? Treatment depends on how severe your condition is. The goal of treatment is to remove the extra air and allow your lung to expand back to its normal size.  For a small pneumothorax: ? No  treatment may be needed. ? Extra oxygen is sometimes used to make it go away more quickly.  For a large pneumothorax or a pneumothorax that is causing symptoms, a procedure is done to drain the air from your lungs. To do this, a health care provider may use: ? A needle with a syringe. This is used to suck air from a pleural space where no additional leakage is taking place. ? A chest tube. This is used to suck air where there is ongoing leakage into the pleural space. The chest tube may need to remain in place for several days until the air leak has healed.  In more severe cases, surgery may be needed to repair the damage that is causing the leak.  If you have multiple pneumothorax episodes or have an air leak that will not heal, a procedure called a pleurodesis may be done. A medicine is placed in the pleural space to irritate the tissues around the lung so that the lung will stick to the chest wall, seal any leaks, and stop any buildup of air in that space. If you have an underlying lung problem, severe symptoms, or a large pneumothorax you will usually need to stay in the hospital. Follow these instructions at home: Lifestyle  Do not use any products that contain nicotine or tobacco, such as cigarettes and e-cigarettes. These are major risk factors in pneumothorax. If you need help quitting, ask your health care provider.  Do not lift anything that is heavier than 10 lb (4.5 kg), or the limit that your health care   provider tells you, until he or she says that it is safe.  Avoid activities that take a lot of effort (strenuous) for as long as told by your health care provider.  Return to your normal activities as told by your health care provider. Ask your health care provider what activities are safe for you.  Do not fly in an airplane or scuba dive until your health care provider says it is okay. General instructions  Take over-the-counter and prescription medicines only as told by your  health care provider.  If a cough or pain makes it difficult for you to sleep at night, try sleeping in a semi-upright position in a recliner or by using 2 or 3 pillows.  If you had a chest tube and it was removed, ask your health care provider when you can remove the bandage (dressing). While the dressing is in place, do not allow it to get wet.  Keep all follow-up visits as told by your health care provider. This is important. Contact a health care provider if:  You cough up thick mucus (sputum) that is yellow or green in color.  You were treated with a chest tube, and you have redness, increasing pain, or discharge at the site where it was placed. Get help right away if:  You have increasing chest pain or shortness of breath.  You have a cough that will not go away.  You begin coughing up blood.  You have pain that is getting worse or is not controlled with medicines.  The site where your chest tube was located opens up.  You feel air coming out of the site where the chest tube was placed.  You have a fever or persistent symptoms for more than 2-3 days.  You have a fever and your symptoms suddenly get worse. These symptoms may represent a serious problem that is an emergency. Do not wait to see if the symptoms will go away. Get medical help right away. Call your local emergency services (911 in the [place] Do not drive yourself to the hospital. Summary  A pneumothorax, commonly called a collapsed lung, is a condition in which air leaks from a lung and gets trapped between the lung and the chest wall (pleural space).  The buildup of air may be small or large. A small pneumothorax may go away on its own. A large pneumothorax will require treatment and hospitalization.  Treatment for this condition depends on how severe the pneumothorax is. The goal of treatment is to remove the extra air and allow the lung to expand back to its normal size. This information is not intended to  replace advice given to you by your health care provider. Make sure you discuss any questions you have with your health care provider. Document Revised: 10/09/2017 Document Reviewed: 10/05/2017 Elsevier Patient Education  2020 [employer]  

## 2020-08-12 NOTE — Discharge Summary (Signed)
Physician Discharge Summary  Patient ID: Jay Mitchell MRN: [id number] DOB/AGE: [birth year] 64 y.o.  Admit date: 08/10/2020 Discharge date: 08/13/2020  Admission Diagnoses:  Patient Active Problem List   Diagnosis Date Noted   Spontaneous pneumothorax 08/10/2020   Hyperglycemia 02/15/2018   PNA (pneumonia) 08/17/2017   Acute bronchitis 08/03/2017   Rhinitis, chronic 05/12/2017   Lung blebs (HCC) 09/17/2016   Spontaneous tension pneumothorax    GERD (gastroesophageal reflux disease)    COPD with chronic bronchitis and emphysema (HCC)    HYPERCHOLESTEROLEMIA 08/31/2007   Discharge Diagnoses:  Patient Active Problem List   Diagnosis Date Noted   Spontaneous pneumothorax 08/10/2020   Hyperglycemia 02/15/2018   PNA (pneumonia) 08/17/2017   Acute bronchitis 08/03/2017   Rhinitis, chronic 05/12/2017   Lung blebs (HCC) 09/17/2016   Spontaneous tension pneumothorax    GERD (gastroesophageal reflux disease)    COPD with chronic bronchitis and emphysema (HCC)    HYPERCHOLESTEROLEMIA 08/31/2007   Discharged Condition: good  History of Present Illness:  Jay Mitchell is 64 yo man with a history of tobacco abuse, COPD, left pneumothorax and reflux.  He presents with a 5 day history of shortness of breath. Had a left pneumothorax in 2017, which ultimately required a VATS for bleb resection. The patient was in his usual state of health until 5 days ago when he had rather sudden onset of dyspnea, but no chest pain. He did have some discomfort in his left upper back. He had a tele visit and was prescribed antibiotics and steroids but did not have any improvement. Today went to see Dr. Joneen Caraway. A CXR showed a moderate right pneumothorax. He was sent to ED.  Hospital Course:   Cardiothoracic surgery consult was requested.  Dr. Dorris Fetch placed a right sided chest tube.  The patient tolerated the procedure without difficulty.  Follow up CXR showed near complete resolution of  pneumothorax.  The patient's chest tube has not exhibited and air leak.  It remained on water seal for 24 hours.  CXR has remained stable.  CT was removed on 08/12/2020.  Follow up CXR showed no evidence of recurrent pneumothorax.  The patient has had some HTN during his stay.  The patient denies previous history and states this happens when he goes to the doctor.  He was encouraged to follow up with his primary care provider.  He is medically stable for discharge home today.  Treatments: surgery: Right sided chest tube  Discharge Exam: Blood pressure 121/69, pulse 88, temperature (!) 97.5 F (36.4 C), temperature source Oral, resp. rate 14, height 6\' 1"  (1.854 m), weight 86.6 kg, SpO2 94 %.  General appearance: alert, cooperative and no distress Neurologic: intact Heart: regular rate and rhythm Lungs: breath sounds are clear. CXR shows lucency over anterior mediastinum. No significant anterior PTX. Wound: Secure dressing over the CT insertion site, remains dry.   Disposition:    Allergies as of 08/13/2020      Reactions   Fentanyl Other (See Comments)   Stopped breathing   Doxycycline Nausea And Vomiting   Meperidine Hcl Nausea Only      Medication List    TAKE these medications   acetaminophen 325 MG tablet Commonly known as: TYLENOL Take 650 mg by mouth every 6 (six) hours as needed for headache (pain).   albuterol 108 (90 Base) MCG/ACT inhaler Commonly known as: VENTOLIN HFA Inhale 2 puffs into the lungs every 6 (six) hours as needed for wheezing or shortness of breath.   amoxicillin-clavulanate  875-125 MG tablet Commonly known as: AUGMENTIN Take 1 tablet by mouth 2 (two) times daily.   Anoro Ellipta 62.5-25 MCG/INH Aepb Generic drug: umeclidinium-vilanterol INHALE 1 PUFF BY MOUTH EVERY DAY What changed:   how much to take  how to take this  when to take this  additional instructions   cetirizine 10 MG tablet Commonly known as: ZYRTEC Take 10 mg by mouth at  bedtime.   diphenhydramine-acetaminophen 25-500 MG Tabs tablet Commonly known as: TYLENOL PM Take 1 tablet by mouth at bedtime.   esomeprazole 20 MG capsule Commonly known as: NEXIUM Take 20 mg by mouth daily.   fluticasone 50 MCG/ACT nasal spray Commonly known as: FLONASE SPRAY 1 OR 2 TIMES INTO EACH NOSTRIL TWICE A DAY What changed: See the new instructions.   LANSOPRAZOLE PO Take 1 tablet by mouth at bedtime.   melatonin 5 MG Tabs Take 10 mg by mouth.   multivitamin with minerals Tabs tablet Take 1 tablet by mouth daily.   naproxen sodium 220 MG tablet Commonly known as: ALEVE Take 220-440 mg by mouth 2 (two) times daily as needed (pain/headache).   OXYGEN Inhale 3 L into the lungs at bedtime. During sleep   phenylephrine 10 MG Tabs tablet Commonly known as: SUDAFED PE Take 10 mg by mouth every 4 (four) hours as needed (congestion).   predniSONE 20 MG tablet Commonly known as: DELTASONE Take 20 mg by mouth 2 (two) times daily.   vitamin C 1000 MG tablet Take 1,000 mg by mouth daily.   VITAMIN D3 PO Take 1 tablet by mouth at bedtime.       Follow-up Information    Loreli Slot, MD. Go on 08/28/2020.   Specialty: Cardiothoracic Surgery Why: Your appointment is at 10:45 AM on Tuesday, 08/28/2020. Please arrive 30 minutes early for a chest x-ray to be performed by [facility]  Imaging located on the first floor of the same building. Contact information: [address] [place] [address] [phone]               Signed:  Leary Roca, PA-C 08/13/2020, 10:30 AM

## 2020-08-13 ENCOUNTER — Inpatient Hospital Stay (HOSPITAL_COMMUNITY): Payer: Medicare Other

## 2020-08-13 NOTE — Progress Notes (Signed)
Pt discharged from unit. Medication/discharge instruction given, VVS  Yonatan Guitron K Shantasia Hunnell, RN   

## 2020-08-13 NOTE — Progress Notes (Addendum)
      [address]       [name] [address]             [phone]         Subjective: Feels good, rested well.  No change in breathing since the chest tube removed.  On nocturnal O2 chronically.   Objective: Vital signs in last 24 hours: Temp:  [97.8 F (36.6 C)-98.5 F (36.9 C)] 97.8 F (36.6 C) (10/04 0325) Pulse Rate:  [75-97] 75 (10/04 0325) Cardiac Rhythm: Normal sinus rhythm (10/03 2000) Resp:  [13-20] 17 (10/04 0325) BP: (121-144)/(71-97) 137/86 (10/04 0325) SpO2:  [93 %-95 %] 95 % (10/04 0325)     Intake/Output from previous day: 10/03 0701 - 10/04 0700 In: 240 [P.O.:240] Out: 650 [Urine:650] Intake/Output this shift: No intake/output data recorded.  General appearance: alert, cooperative and no distress Neurologic: intact Heart: regular rate and rhythm Lungs: breath sounds are clear. CXR shows lucency over anterior mediastinum. No significant anterior PTX. Wound: Secure dressing over the CT insertion site, remains dry.   Lab Results: Recent Labs    08/10/20 1507 08/11/20 0153  WBC 14.5* 16.1*  HGB 14.7 13.4  HCT 43.5 40.9  PLT 366 321   BMET:  Recent Labs    08/10/20 1507  NA 138  K 4.0  CL 104  CO2 23  GLUCOSE 121*  BUN 14  CREATININE 0.91  CALCIUM 9.0    PT/INR: No results for input(s): LABPROT, INR in the last 72 hours. ABG    Component Value Date/Time   PHART 7.392 09/18/2016 0412   HCO3 26.4 09/18/2016 0412   TCO2 28 09/18/2016 0412   O2SAT 90.0 09/18/2016 0412   CBG (last 3)  No results for input(s): GLUCAP in the last 72 hours.  Assessment/Plan:  -POD3 right pigtail catheter placement for spontaneous PTX in 63yo with COPD and h/o left PTX.  Stable respiratory status post removal of the chest tube yesterday.  CXR OK.  Anticipate discharge later today.   LOS: 3 days    Parke Poisson [phone] 08/13/2020 Patient seen and examined, agree with above CXR Ok  Will dc home today He is  aware of risk for recurrent pneumothorax  Viviann Spare C. Dorris Fetch, MD [facility] [phone]

## 2020-08-15 ENCOUNTER — Inpatient Hospital Stay (HOSPITAL_COMMUNITY)
Admission: EM | Admit: 2020-08-15 | Discharge: 2020-08-21 | DRG: 164 | Disposition: A | Discharge status: Home / Self Care | Payer: Medicare Other | Attending: Thoracic Surgery (Cardiothoracic Vascular Surgery) | Admitting: Thoracic Surgery (Cardiothoracic Vascular Surgery)

## 2020-08-15 ENCOUNTER — Encounter (HOSPITAL_COMMUNITY): Payer: Self-pay | Admitting: Thoracic Surgery (Cardiothoracic Vascular Surgery)

## 2020-08-15 ENCOUNTER — Emergency Department (HOSPITAL_COMMUNITY): Payer: Medicare Other

## 2020-08-15 ENCOUNTER — Other Ambulatory Visit: Payer: Self-pay

## 2020-08-15 ENCOUNTER — Inpatient Hospital Stay (HOSPITAL_COMMUNITY): Payer: Medicare Other

## 2020-08-15 DIAGNOSIS — J439 Emphysema, unspecified: Secondary | ICD-10-CM | POA: Diagnosis present

## 2020-08-15 DIAGNOSIS — Z20822 Contact with and (suspected) exposure to covid-19: Secondary | ICD-10-CM | POA: Diagnosis present

## 2020-08-15 DIAGNOSIS — Z881 Allergy status to other antibiotic agents status: Secondary | ICD-10-CM | POA: Diagnosis not present

## 2020-08-15 DIAGNOSIS — Z818 Family history of other mental and behavioral disorders: Secondary | ICD-10-CM | POA: Diagnosis not present

## 2020-08-15 DIAGNOSIS — J9311 Primary spontaneous pneumothorax: Secondary | ICD-10-CM

## 2020-08-15 DIAGNOSIS — Z83438 Family history of other disorder of lipoprotein metabolism and other lipidemia: Secondary | ICD-10-CM

## 2020-08-15 DIAGNOSIS — Z9981 Dependence on supplemental oxygen: Secondary | ICD-10-CM

## 2020-08-15 DIAGNOSIS — J9811 Atelectasis: Secondary | ICD-10-CM | POA: Diagnosis not present

## 2020-08-15 DIAGNOSIS — Z8349 Family history of other endocrine, nutritional and metabolic diseases: Secondary | ICD-10-CM

## 2020-08-15 DIAGNOSIS — Z8249 Family history of ischemic heart disease and other diseases of the circulatory system: Secondary | ICD-10-CM | POA: Diagnosis not present

## 2020-08-15 DIAGNOSIS — Z01818 Encounter for other preprocedural examination: Secondary | ICD-10-CM

## 2020-08-15 DIAGNOSIS — Z8262 Family history of osteoporosis: Secondary | ICD-10-CM | POA: Diagnosis not present

## 2020-08-15 DIAGNOSIS — K219 Gastro-esophageal reflux disease without esophagitis: Secondary | ICD-10-CM | POA: Diagnosis present

## 2020-08-15 DIAGNOSIS — J9383 Other pneumothorax: Secondary | ICD-10-CM | POA: Diagnosis present

## 2020-08-15 DIAGNOSIS — Z888 Allergy status to other drugs, medicaments and biological substances status: Secondary | ICD-10-CM | POA: Diagnosis not present

## 2020-08-15 DIAGNOSIS — Z09 Encounter for follow-up examination after completed treatment for conditions other than malignant neoplasm: Secondary | ICD-10-CM

## 2020-08-15 DIAGNOSIS — Z87891 Personal history of nicotine dependence: Secondary | ICD-10-CM | POA: Diagnosis not present

## 2020-08-15 DIAGNOSIS — J939 Pneumothorax, unspecified: Secondary | ICD-10-CM

## 2020-08-15 DIAGNOSIS — Z4682 Encounter for fitting and adjustment of non-vascular catheter: Secondary | ICD-10-CM

## 2020-08-15 DIAGNOSIS — Z885 Allergy status to narcotic agent status: Secondary | ICD-10-CM | POA: Diagnosis not present

## 2020-08-15 LAB — URINALYSIS, ROUTINE W REFLEX MICROSCOPIC
Bilirubin Urine: NEGATIVE
Glucose, UA: NEGATIVE mg/dL
Hgb urine dipstick: NEGATIVE
Ketones, ur: NEGATIVE mg/dL
Leukocytes,Ua: NEGATIVE
Nitrite: NEGATIVE
Protein, ur: NEGATIVE mg/dL
Specific Gravity, Urine: 1.013 (ref 1.005–1.030)
pH: 5 (ref 5.0–8.0)

## 2020-08-15 LAB — CBC
HCT: 46.5 % (ref 39.0–52.0)
Hemoglobin: 16 g/dL (ref 13.0–17.0)
MCH: 30.1 pg (ref 26.0–34.0)
MCHC: 34.4 g/dL (ref 30.0–36.0)
MCV: 87.6 fL (ref 80.0–100.0)
Platelets: 366 10*3/uL (ref 150–400)
RBC: 5.31 MIL/uL (ref 4.22–5.81)
RDW: 12.6 % (ref 11.5–15.5)
WBC: 12.4 10*3/uL — ABNORMAL HIGH (ref 4.0–10.5)
nRBC: 0 % (ref 0.0–0.2)

## 2020-08-15 LAB — BASIC METABOLIC PANEL
Anion gap: 14 (ref 5–15)
BUN: 6 mg/dL — ABNORMAL LOW (ref 8–23)
CO2: 20 mmol/L — ABNORMAL LOW (ref 22–32)
Calcium: 9.1 mg/dL (ref 8.9–10.3)
Chloride: 103 mmol/L (ref 98–111)
Creatinine, Ser: 1.03 mg/dL (ref 0.61–1.24)
GFR calc non Af Amer: >60 mL/min (ref >60)
Glucose, Bld: 127 mg/dL — ABNORMAL HIGH (ref 70–99)
Potassium: 4.2 mmol/L (ref 3.5–5.1)
Sodium: 137 mmol/L (ref 135–145)

## 2020-08-15 LAB — RESPIRATORY PANEL BY RT PCR (FLU A&B, COVID)
Influenza A by PCR: NEGATIVE
Influenza B by PCR: NEGATIVE
SARS Coronavirus 2 by RT PCR: NEGATIVE

## 2020-08-15 LAB — TYPE AND SCREEN
ABO/RH(D): O POS
Antibody Screen: NEGATIVE

## 2020-08-15 LAB — APTT: aPTT: 39 seconds — ABNORMAL HIGH (ref 24–36)

## 2020-08-15 LAB — PROTIME-INR
INR: 1 (ref 0.8–1.2)
Prothrombin Time: 12.4 seconds (ref 11.4–15.2)

## 2020-08-15 MED ORDER — TRAMADOL HCL 50 MG PO TABS
50.0000 mg | ORAL_TABLET | Freq: Three times a day (TID) | ORAL | Status: DC | PRN
  Administered 2020-08-17 – 2020-08-20 (×2): 50 mg via ORAL
  Filled 2020-08-15 (×2): qty 1

## 2020-08-15 MED ORDER — SODIUM CHLORIDE 0.9% FLUSH
3.0000 mL | Freq: Two times a day (BID) | INTRAVENOUS | Status: DC
  Administered 2020-08-15 – 2020-08-16 (×3): 3 mL via INTRAVENOUS

## 2020-08-15 MED ORDER — LEVALBUTEROL HCL 0.63 MG/3ML IN NEBU: 0.6300 mg | INHALATION_SOLUTION | Freq: Four times a day (QID) | RESPIRATORY_TRACT | Status: DC | PRN

## 2020-08-15 MED ORDER — SODIUM CHLORIDE 0.9% FLUSH: 3.0000 mL | INTRAVENOUS | Status: DC | PRN

## 2020-08-15 MED ORDER — UMECLIDINIUM-VILANTEROL 62.5-25 MCG/INH IN AEPB
1.0000 | INHALATION_SPRAY | Freq: Every day | RESPIRATORY_TRACT | Status: DC
  Administered 2020-08-16: 1 via RESPIRATORY_TRACT
  Filled 2020-08-15: qty 14

## 2020-08-15 MED ORDER — LORATADINE 10 MG PO TABS
10.0000 mg | ORAL_TABLET | Freq: Every day | ORAL | Status: DC
  Administered 2020-08-15 – 2020-08-21 (×7): 10 mg via ORAL
  Filled 2020-08-15 (×7): qty 1

## 2020-08-15 MED ORDER — MELATONIN 5 MG PO TABS
5.0000 mg | ORAL_TABLET | Freq: Every day | ORAL | Status: DC
  Administered 2020-08-15: 5 mg via ORAL
  Filled 2020-08-15: qty 1

## 2020-08-15 MED ORDER — SODIUM CHLORIDE 0.9% FLUSH
3.0000 mL | Freq: Two times a day (BID) | INTRAVENOUS | Status: DC
  Administered 2020-08-16: 3 mL via INTRAVENOUS

## 2020-08-15 MED ORDER — MORPHINE SULFATE (PF) 2 MG/ML IV SOLN: 2.0000 mg | INTRAVENOUS | Status: DC | PRN

## 2020-08-15 MED ORDER — ONDANSETRON HCL 4 MG/2ML IJ SOLN: 4.0000 mg | Freq: Four times a day (QID) | INTRAMUSCULAR | Status: DC | PRN

## 2020-08-15 MED ORDER — METOPROLOL TARTRATE 5 MG/5ML IV SOLN: 2.5000 mg | Freq: Four times a day (QID) | INTRAVENOUS | Status: DC | PRN

## 2020-08-15 MED ORDER — ENOXAPARIN SODIUM 40 MG/0.4ML ~~LOC~~ SOLN
40.0000 mg | SUBCUTANEOUS | Status: DC
  Administered 2020-08-15 – 2020-08-20 (×6): 40 mg via SUBCUTANEOUS
  Filled 2020-08-15 (×6): qty 0.4

## 2020-08-15 MED ORDER — PANTOPRAZOLE SODIUM 40 MG PO TBEC
40.0000 mg | DELAYED_RELEASE_TABLET | Freq: Every evening | ORAL | Status: DC
  Administered 2020-08-15 – 2020-08-20 (×6): 40 mg via ORAL
  Filled 2020-08-15 (×6): qty 1

## 2020-08-15 MED ORDER — ONDANSETRON HCL 4 MG PO TABS: 4.0000 mg | ORAL_TABLET | Freq: Four times a day (QID) | ORAL | Status: DC | PRN

## 2020-08-15 MED ORDER — HYDROMORPHONE HCL 1 MG/ML IJ SOLN
0.5000 mg | INTRAMUSCULAR | Status: DC | PRN
  Administered 2020-08-17 (×3): 0.5 mg via INTRAVENOUS
  Filled 2020-08-15 (×3): qty 1

## 2020-08-15 MED ORDER — HYDROMORPHONE HCL 1 MG/ML IJ SOLN: 0.5000 mg | Freq: Once | INTRAMUSCULAR | Status: AC

## 2020-08-15 MED ORDER — HYDROMORPHONE HCL 1 MG/ML IJ SOLN
INTRAMUSCULAR | Status: AC
  Administered 2020-08-15: 0.5 mg via INTRAVENOUS
  Filled 2020-08-15: qty 1

## 2020-08-15 MED ORDER — CEFAZOLIN SODIUM-DEXTROSE 2-4 GM/100ML-% IV SOLN
2.0000 g | INTRAVENOUS | Status: AC
  Administered 2020-08-16: 2 g via INTRAVENOUS
  Filled 2020-08-15: qty 100

## 2020-08-15 MED ORDER — LIDOCAINE-EPINEPHRINE 1 %-1:100000 IJ SOLN
20.0000 mL | Freq: Once | INTRAMUSCULAR | Status: DC
  Filled 2020-08-15: qty 1

## 2020-08-15 MED ORDER — OXYCODONE HCL 5 MG PO TABS
5.0000 mg | ORAL_TABLET | ORAL | Status: DC | PRN
  Administered 2020-08-15 – 2020-08-20 (×16): 5 mg via ORAL
  Filled 2020-08-15 (×16): qty 1

## 2020-08-15 MED ORDER — SODIUM CHLORIDE 0.9 % IV SOLN: 250.0000 mL | INTRAVENOUS | Status: DC | PRN

## 2020-08-15 MED ORDER — SODIUM CHLORIDE 0.9% FLUSH: 10.0000 mL | Freq: Three times a day (TID) | INTRAVENOUS | Status: DC

## 2020-08-15 NOTE — Progress Notes (Signed)
Pt received from ED. Chest tube with 5+ air leak to 20 of suction. Telemetry applied. VSS. Pt oriented to room and unit. Call light in reach. Will continue to monitor.  Versie Starks, RN

## 2020-08-15 NOTE — ED Triage Notes (Signed)
BIB [facility] after pt called to report "popping" sound while using inhaler. Pt reports difficulty breathing and 02 sat drop after hearing the pop. Pt place himself on own home 02.  Pt has hx COPD, spontaneous pneumothorax. Pt was d/c Monday r/t to previous pneumo.  B/P 135/80 02 Sat 93 [place] 127 HR  Temp 98.2

## 2020-08-15 NOTE — ED Provider Notes (Signed)
[facility]   CSN: [id number] Arrival date & time: 08/15/20  1134     History Chief Complaint  Patient presents with  . Shortness of Breath    possible pneumothorax    Jay Mitchell is a 64 y.o. male.  HPI 64 year old male with history of COPD, GERD, left pneumothorax requiring VATS procedure, presents to the ER with sudden onset of shortness of breath earlier today.  He states he was using his inhaler and felt a sudden right-sided pain in his chest and a "pop", and started to feel short of breath.  He says he is almost certain that his right lung has collapsed.  He was seen here on 10/1 with a right pneumothorax, admitted by CT surgery and discharged on 10/3, chest tube had been removed with plans for follow-up.  Was unclear if he was hypoxic on room air, patient states that he normally uses 2 L of oxygen at night, and as soon as he started to feel short of breath, he placed himself on 2 L.  He came to the ED probably as he was pretty certain that his lung had collapsed again.  Currently here in the ED at 3 L and satting mid 90s.  Denies any chest pain, nausea, vomiting, abdominal pain, dizziness, fevers, chills.  He is alert, oriented, speaking full sentences.    Past Medical History:  Diagnosis Date  . COPD (chronic obstructive pulmonary disease) (HCC)   . GERD (gastroesophageal reflux disease)   . Spontaneous pneumothorax 09/15/2016   left    Patient Active Problem List   Diagnosis Date Noted  . Spontaneous pneumothorax 08/10/2020  . Hyperglycemia 02/15/2018  . PNA (pneumonia) 08/17/2017  . Acute bronchitis 08/03/2017  . Rhinitis, chronic 05/12/2017  . Lung blebs (HCC) 09/17/2016  . Spontaneous tension pneumothorax   . GERD (gastroesophageal reflux disease)   . COPD with chronic bronchitis and emphysema (HCC)   . HYPERCHOLESTEROLEMIA 08/31/2007    Past Surgical History:  Procedure Laterality Date  . VIDEO ASSISTED  THORACOSCOPY Left 09/17/2016   Procedure: VIDEO ASSISTED THORACOSCOPY multiple/BLEBECTOMY, apical pleurectomy, mechanical pleural abrasion;  Surgeon: Loreli SlotSteven C Hendrickson, MD;  [facility] OR;  Service: Thoracic;  Laterality: Left;       Family History  Problem Relation Age of Onset  . Osteoporosis Mother   . Hypertension Sister   . Thyroid disease Sister   . Hyperlipidemia Sister   . Anxiety disorder Sister     Social History   Tobacco Use  . Smoking status: Former Smoker    Packs/day: 1.50    Years: 25.00    Pack years: 37.50    Types: Cigarettes    Quit date: 2004    Years since quitting: 17.7  . Smokeless tobacco: Never Used  Substance Use Topics  . Alcohol use: No  . Drug use: No    Home Medications Prior to Admission medications   Medication Sig Start Date End Date Taking? Authorizing Provider  acetaminophen (TYLENOL) 325 MG tablet Take 650 mg by mouth every 6 (six) hours as needed for headache (pain).    [provider]  albuterol (VENTOLIN HFA) 108 (90 Base) MCG/ACT inhaler Inhale 2 puffs into the lungs every 6 (six) hours as needed for wheezing or shortness of breath. 05/30/19   Mannam, Praveen, MD  amoxicillin-clavulanate (AUGMENTIN) 875-125 MG tablet Take 1 tablet by mouth 2 (two) times daily.    [provider]  Ascorbic Acid (VITAMIN C) 1000  MG tablet Take 1,000 mg by mouth daily.    [provider]  cetirizine (ZYRTEC) 10 MG tablet Take 10 mg by mouth at bedtime.     [provider]  Cholecalciferol (VITAMIN D3 PO) Take 1 tablet by mouth at bedtime.    [provider]  diphenhydramine-acetaminophen (TYLENOL PM) 25-500 MG TABS tablet Take 1 tablet by mouth at bedtime.     [provider]  esomeprazole (NEXIUM) 20 MG capsule Take 20 mg by mouth daily.     [provider]  fluticasone (FLONASE) 50 MCG/ACT nasal spray SPRAY 1 OR 2 TIMES INTO EACH NOSTRIL TWICE A DAY Patient taking differently: Place 1  spray into both nostrils daily as needed for allergies or rhinitis.  02/24/18   Nyoka Cowden, MD  LANSOPRAZOLE PO Take 1 tablet by mouth at bedtime.     [provider]  melatonin 5 MG TABS Take 10 mg by mouth.    [provider]  Multiple Vitamin (MULTIVITAMIN WITH MINERALS) TABS tablet Take 1 tablet by mouth daily.    [provider]  naproxen sodium (ALEVE) 220 MG tablet Take 220-440 mg by mouth 2 (two) times daily as needed (pain/headache).    [provider]  OXYGEN Inhale 3 L into the lungs at bedtime. During sleep    [provider]  phenylephrine (SUDAFED PE) 10 MG TABS tablet Take 10 mg by mouth every 4 (four) hours as needed (congestion).     [provider]  predniSONE (DELTASONE) 20 MG tablet Take 20 mg by mouth 2 (two) times daily. 08/05/20   [provider]  umeclidinium-vilanterol (ANORO ELLIPTA) 62.5-25 MCG/INH AEPB INHALE 1 PUFF BY MOUTH EVERY DAY Patient taking differently: Inhale 1 puff into the lungs daily.  07/09/20   Mannam, Colbert Coyer, MD    Allergies    Fentanyl, Doxycycline, and Meperidine hcl  Review of Systems   Review of Systems  Constitutional: Negative for chills and fever.  HENT: Negative for ear pain and sore throat.   Eyes: Negative for pain and visual disturbance.  Respiratory: Positive for shortness of breath. Negative for cough.   Cardiovascular: Positive for chest pain. Negative for palpitations.  Gastrointestinal: Negative for abdominal pain and vomiting.  Genitourinary: Negative for dysuria and hematuria.  Musculoskeletal: Negative for arthralgias and back pain.  Skin: Negative for color change and rash.  Neurological: Negative for seizures and syncope.  All other systems reviewed and are negative.   Physical Exam Updated Vital Signs BP 139/89   Pulse (!) 106   Resp 13   SpO2 95%   Physical Exam Vitals and nursing note reviewed.  Constitutional:      General: He is not in acute  distress.    Appearance: He is well-developed. He is not ill-appearing, toxic-appearing or diaphoretic.  HENT:     Head: Normocephalic and atraumatic.  Eyes:     Conjunctiva/sclera: Conjunctivae normal.     Pupils: Pupils are equal, round, and reactive to light.  Cardiovascular:     Rate and Rhythm: Regular rhythm. Tachycardia present.     Heart sounds: No murmur heard.   Pulmonary:     Effort: Pulmonary effort is normal. No tachypnea, accessory muscle usage or respiratory distress.     Breath sounds: Examination of the right-upper field reveals decreased breath sounds. Examination of the right-middle field reveals decreased breath sounds. Examination of the right-lower field reveals decreased breath sounds. Decreased breath sounds present. No wheezing, rhonchi or rales.  Comments: Speaking in  full sentences, no retractions Chest:     Chest wall: No tenderness.  Abdominal:     Palpations: Abdomen is soft.     Tenderness: There is no abdominal tenderness.  Musculoskeletal:     Cervical back: Normal range of motion and neck supple.     Right lower leg: No tenderness. No edema.     Left lower leg: No tenderness. No edema.  Skin:    General: Skin is warm and dry.     Findings: No erythema or rash.  Neurological:     General: No focal deficit present.     Mental Status: He is alert.  Psychiatric:        Mood and Affect: Mood normal.        Behavior: Behavior normal.     ED Results / Procedures / Treatments   Labs (all labs ordered are listed, but only abnormal results are displayed) Labs Reviewed  CBC - Abnormal; Notable for the following components:      Result Value   WBC 12.4 (*)    All other components within normal limits  BASIC METABOLIC PANEL - Abnormal; Notable for the following components:   CO2 20 (*)    Glucose, Bld 127 (*)    BUN 6 (*)    All other components within normal limits  RESPIRATORY PANEL BY RT PCR (FLU A&B, COVID)    EKG EKG  Interpretation  Date/Time:  Wednesday August 15 2020 11:40:00 EDT Ventricular Rate:  126 PR Interval:    QRS Duration: 80 QT Interval:  296 QTC Calculation: 429 R Axis:   43 Text Interpretation: Sinus tachycardia Anteroseptal infarct, old rate faster, otherwise similar to previous Confirmed by Frederick Peers [phone]) on 08/15/2020 11:41:21 AM   Radiology DG Chest Portable 1 View  Result Date: 08/15/2020 CLINICAL DATA:  Chest tube placement for pneumothorax EXAM: PORTABLE CHEST 1 VIEW COMPARISON:  August 15, 2020 study obtained earlier in the day FINDINGS: There is now a chest tube on the right. The right-sided pneumothorax is considerably smaller, although there is persistent apical, medial basilar, and lateral basilar component side this pneumothorax. No tension component. There is atelectatic change in the right lower lung region. There is also slight atelectasis in the left base. There is postoperative change in the left upper lobe. No edema or airspace opacity. Heart size is normal. Pulmonary vascular within normal limits. No adenopathy. No bone lesions. IMPRESSION: Chest tube on the right now present with right-sided pneumothorax much smaller. No tension component. Atelectatic change in the lower lung regions, more on the right than on the left. Stable postoperative change left upper lobe. No consolidation. Stable cardiac silhouette. Electronically Signed   By: Bretta Bang III M.D.   On: 08/15/2020 14:53   DG Chest Portable 1 View  Result Date: 08/15/2020 CLINICAL DATA:  Shortness of breath. EXAM: PORTABLE CHEST 1 VIEW COMPARISON:  08/13/2020 FINDINGS: Large right pneumothorax. The right lung is largely collapsed. Possible slight leftward mediastinal shift. The left lung is clear. No visible pleural effusion. Cardiomediastinal silhouette is within normal limits. No acute osseous abnormality. IMPRESSION: Large right pneumothorax. Possible slight leftward mediastinal shift. Critical  Value/emergent results were called by telephone at the time of interpretation on 08/15/2020 at 12:18 pm to provider Dr. Clarene Duke, Who verbally acknowledged these results. Electronically Signed   By: Feliberto Harts MD   On: 08/15/2020 12:25    Procedures Procedures (including critical care time)  Medications Ordered in ED Medications  sodium chloride flush (NS) 0.9 % injection 10 mL (has no administration in time range)  lidocaine-EPINEPHrine (XYLOCAINE W/EPI) 1 %-1:100000 (with pres) injection 20 mL (has no administration in time range)  HYDROmorphone (DILAUDID) injection 0.5 mg (0.5 mg Intravenous Given 08/15/20 1513)    ED Course  I have reviewed the triage vital signs and the nursing notes.  Pertinent labs & imaging results that were available during my care of the patient were reviewed by me and considered in my medical decision making (see chart for details).    MDM Rules/Calculators/A&P                          64 year old male with complaints of shortness of breath, right-sided chest discomfort, with concerns of right-sided pneumothorax.  He was tachycardic in the 120s on arrival, on 3 L of nasal cannula with appropriate oxygenation.  He was just recently discharged on Monday after a pneumothorax in that same lung.  No chest tube in place.  His lung sounds are significant diminished on the right side.  Chest x-ray confirms large right-sided pneumothorax with possible slight leftward mediastinal shift.  Dr. Clarene Duke also saw and evaluated the patient, we placed the patient on a nonrebreather (nursing staff noted desaturations to mid 80s on RA), consulted CT surgery.  He was previously seen here by Dr. Dorris Fetch with CT surgery. Dr. Karl Ito w/ CT surgery performed bedside chest tubes.  Labs without electrode abnormalities, CO2 of 20.  Normal anion gap.  WBC count of 12.4, however no other abnormalities.  His Covid is negative.  EKG of sinus tach.  Patient will be admitted by CT surgery,  remains hemodynamically stable at this time.  Final Clinical Impression(s) / ED Diagnoses Final diagnoses:  Recurrent pneumothorax    Rx / DC Orders ED Discharge Orders    None       Mare Ferrari, PA-C 08/15/20 1536    Little, Ambrose Finland, MD 08/16/20 [phone]

## 2020-08-15 NOTE — ED Notes (Addendum)
PT Desat to upper 80's. Pt placed on 4 L [place]

## 2020-08-15 NOTE — H&P (Addendum)
[address]            [place] [address]          [phone]     Jay Mitchell is an 64 y.o. male. February 27, 1956 LFY:[id number]   Chief Complaint: Shortness of breath  History of Presenting Illness:  This is a 64 year old male with a past medical history of COPD, remote tobacco abuse (quit 2004), and  previous left spontaneous pneumothorax (had left VATS, multiple blebectomies of LUL, apical pleurectomy, and mechanical pleurodesis Nov 2017) who was discharged on Monday after being admitted for a right spontaneous pneumothorax that required a chest tube. Patient was using his inhaler earlier this am when he heard a "popping" sound. He became more short of breath afterward. EMS was summonsed and he presented to [facility] ED. Chest x ray showed a very large right pneumothorax, possible slight leftward mediastinal shift. Dr. Tyrone Sage placed a right chest tube. Follow up chest x ray showed smaller right apical pneumothorax, no tension component, with atelectatic changes.   Past Medical History: Past Medical History:  Diagnosis Date  . COPD (chronic obstructive pulmonary disease) (HCC)   . GERD (gastroesophageal reflux disease)   . Spontaneous pneumothorax 09/15/2016   left    Past Surgical History: Past Surgical History:  Procedure Laterality Date  . VIDEO ASSISTED THORACOSCOPY Left 09/17/2016   Procedure: VIDEO ASSISTED THORACOSCOPY multiple/BLEBECTOMY, apical pleurectomy, mechanical pleural abrasion;  Surgeon: Loreli Slot, MD;  Location: [facility] OR;  Service: Thoracic;  Laterality: Left;    Family History: Family History  Problem Relation Age of Onset  . Osteoporosis Mother   . Hypertension Sister   . Thyroid disease Sister   . Hyperlipidemia Sister   . Anxiety disorder Sister      Social History: Patient reports that he quit smoking about 17 years ago. His smoking use included cigarettes. He has a 37.50 pack-year smoking history. He has  never used smokeless tobacco. He reports that he does not drink alcohol and does not use drugs.  Allergies:  Allergies  Allergen Reactions  . Fentanyl Other (See Comments)    Stopped breathing  . Doxycycline Nausea And Vomiting  . Meperidine Hcl Nausea Only    Review of Systems:  Cardiac Review of Systems: Y or N  Chest Pain [ N ] Resting SOB [ Y-upon admission ] Exertional SOB [ Y-upon admission ]  Syncope Klaus.Mock ] Presyncope [ N ]  General Review of Systems: [Y] = yes [N ]=no  Constitional: nausea [ N]; night sweats Klaus.Mock ]; fever Klaus.Mock ]; or chills Klaus.Mock ];  Eye : Amaurosis fugax[N ];  Resp: cough Klaus.Mock ]; wheezing[ N]; hemoptysis[N ];  GI: melena[N ]; hematochezia Klaus.Mock ];  GU: hematuria[N ];  Heme/Lymph:anemia[N ];  Neuro: TIA[ N];stroke[ N]; seizures[N ]; paresthesias[ ] ;  Endocrine: diabetes[N ];   BP 139/89   Pulse (!) 106   Resp 13   SpO2 95%   Physical Exam: General appearance: alert, cooperative and no distress Heart: Slightly tachycardic Lungs: Slightly decreased right apex, left lung clear Abdomen: Soft, non tender, bowel sounds present Extremities: No LE edema   Diagnostic Studies and Laboratory Results: Results for orders placed or performed during the hospital encounter of 08/15/20 (from the past 48 hour(s))  CBC     Status: Abnormal   Collection Time: 08/15/20 11:47 AM  Result Value Ref Range  WBC 12.4 (H) 4.0 - 10.5 K/uL   RBC 5.31 4.22 - 5.81 MIL/uL   Hemoglobin 16.0 13.0 - 17.0 g/dL   HCT 57.3 39 - 52 %   MCV 87.6 80.0 - 100.0 fL   MCH 30.1 26.0 - 34.0 pg   MCHC 34.4 30.0 - 36.0 g/dL   RDW 22.0 25.4 - 27.0 %   Platelets 366 150 - 400 K/uL   nRBC 0.0 0.0 - 0.2 %    Comment: Performed at [facility], [address], [place], [place] [address]  Basic metabolic panel     Status: Abnormal   Collection Time: 08/15/20 11:47 AM  Result Value Ref Range   Sodium 137 135 - 145 mmol/L   Potassium 4.2 3.5 - 5.1 mmol/L   Chloride 103 98 - 111 mmol/L   CO2 20 (L)  22 - 32 mmol/L   Glucose, Bld 127 (H) 70 - 99 mg/dL    Comment: Glucose reference range applies only to samples taken after fasting for at least 8 hours.   BUN 6 (L) 8 - 23 mg/dL   Creatinine, Ser 2.83 0.61 - 1.24 mg/dL   Calcium 9.1 8.9 - 15.1 mg/dL   GFR calc non Af Amer >60 >60 mL/min   Anion gap 14 5 - 15    Comment: Performed at [facility], [address] [place], [place] [address]  Respiratory Panel by RT PCR (Flu A&B, Covid) - Nasopharyngeal Swab     Status: None   Collection Time: 08/15/20 12:42 PM   Specimen: Nasopharyngeal Swab  Result Value Ref Range   SARS Coronavirus 2 by RT PCR NEGATIVE NEGATIVE    Comment: (NOTE) SARS-CoV-2 target nucleic acids are NOT DETECTED.  The SARS-CoV-2 RNA is generally detectable in upper respiratoy specimens during the acute phase of infection. The lowest concentration of SARS-CoV-2 viral copies this assay can detect is 131 copies/mL. A negative result does not preclude SARS-Cov-2 infection and should not be used as the sole basis for treatment or other patient management decisions. A negative result may occur with  improper specimen collection/handling, submission of specimen other than nasopharyngeal swab, presence of viral mutation(s) within the areas targeted by this assay, and inadequate number of viral copies (<131 copies/mL). A negative result must be combined with clinical observations, patient history, and epidemiological information. The expected result is Negative.  Fact Sheet for Patients:  https://www.moore.com/  Fact Sheet for Healthcare Providers:  https://www.young.biz/  This test is no t yet approved or cleared by the [place] FDA and  has been authorized for detection and/or diagnosis of SARS-CoV-2 by FDA under an Emergency Use Authorization (EUA). This EUA will remain  in effect (meaning this test can be used) for the duration of the COVID-19 declaration under  Section 564(b)(1) of the Act, 21 U.S.C. section 360bbb-3(b)(1), unless the authorization is terminated or revoked sooner.     Influenza A by PCR NEGATIVE NEGATIVE   Influenza B by PCR NEGATIVE NEGATIVE    Comment: (NOTE) The Xpert Xpress SARS-CoV-2/FLU/RSV assay is intended as an aid in  the diagnosis of influenza from Nasopharyngeal swab specimens and  should not be used as a sole basis for treatment. Nasal washings and  aspirates are unacceptable for Xpert Xpress SARS-CoV-2/FLU/RSV  testing.  Fact Sheet for Patients: https://www.moore.com/  Fact Sheet for Healthcare Providers: https://www.young.biz/  This test is not yet approved or cleared by the [place] FDA and  has been authorized for detection and/or  diagnosis of SARS-CoV-2 by  FDA under an Emergency Use Authorization (EUA). This EUA will remain  in effect (meaning this test can be used) for the duration of the  Covid-19 declaration under Section 564(b)(1) of the Act, 21  U.S.C. section 360bbb-3(b)(1), unless the authorization is  terminated or revoked. Performed at [facility], [address], [place], [place] [address]    DG Chest Portable 1 View  Result Date: 08/15/2020 CLINICAL DATA:  Chest tube placement for pneumothorax EXAM: PORTABLE CHEST 1 VIEW COMPARISON:  August 15, 2020 study obtained earlier in the day FINDINGS: There is now a chest tube on the right. The right-sided pneumothorax is considerably smaller, although there is persistent apical, medial basilar, and lateral basilar component side this pneumothorax. No tension component. There is atelectatic change in the right lower lung region. There is also slight atelectasis in the left base. There is postoperative change in the left upper lobe. No edema or airspace opacity. Heart size is normal. Pulmonary vascular within normal limits. No adenopathy. No bone lesions. IMPRESSION: Chest tube on the right now present  with right-sided pneumothorax much smaller. No tension component. Atelectatic change in the lower lung regions, more on the right than on the left. Stable postoperative change left upper lobe. No consolidation. Stable cardiac silhouette. Electronically Signed   By: Bretta Bang III M.D.   On: 08/15/2020 14:53   DG Chest Portable 1 View  Result Date: 08/15/2020 CLINICAL DATA:  Shortness of breath. EXAM: PORTABLE CHEST 1 VIEW COMPARISON:  08/13/2020 FINDINGS: Large right pneumothorax. The right lung is largely collapsed. Possible slight leftward mediastinal shift. The left lung is clear. No visible pleural effusion. Cardiomediastinal silhouette is within normal limits. No acute osseous abnormality. IMPRESSION: Large right pneumothorax. Possible slight leftward mediastinal shift. Critical Value/emergent results were called by telephone at the time of interpretation on 08/15/2020 at 12:18 pm to provider Dr. Clarene Duke, Who verbally acknowledged these results. Electronically Signed   By: Feliberto Harts MD   On: 08/15/2020 12:25    Assessment/Plan 1. Recurrent, right spontanaeous pneumothorax. S/p placement of right chest tube. Will consider CT scan; CT done in July 2020 showed emphysema, stable bilateral pulmonary nodules. Will likely need right VATS, pleurodesis. Dr. Dorris Fetch to evaluate. 2. History of COPD(chronic obstructive pulmonary disease) (HCC) and severe bullous emphysema. Continue Anoro Ellipta and Xopenex PRN 3. History of GERD-Protonix daily  Ardelle Balls, PA-C 08/15/2020, 3:24 PM  Patient seen and examined, agree with above Mr. Star is a 64 yo man with severe COPD who has a history of L VATS for recurrent pneumothorax. Presented about a week ago with a right pneumothorax. Probably had been present for several days. Pigtail placed and air leak resolved. Went home 2 days ago. This AM had sudden onset of CP and SOB, and felt a pop. In ED  He had a large right pneumothorax. Dr.  Tyrone Sage placed a CT and there is a large air leak.  This leak is far greater than anything he had last week. That and the fact that this is a recurrent pneumothorax are indications for surgery.  I recommended we proceed with Right VATS for stapling of blebs and pleural abrasion tomorrow. He had a similar procedure on the left previously so is aware of the general nature of the procedure. I informed him of the indications, risks, benefits and alternatives. He understands the risks include but are not limited to death, MI, DVT, PE, bleeding, possible need for transfusion, infection, prolonged  air leak, respiratory or renal failure, as well as other unforeseeable complications.  He accepts the risks and agrees to proceed.  Salvatore DecentSteven C. Dorris FetchHendrickson, MD [facility] [phone]

## 2020-08-15 NOTE — Procedures (Signed)
Insertion of Chest Tube Procedure Note  Jay Mitchell  [id number]  1956/10/13  Date:08/15/20  Time:2:47 PM    Provider Performing: Delight Ovens   Procedure: Chest Tube Insertion [id number]  Indication(s) Pneumothorax  Consent Risks of the procedure as well as the alternatives and risks of each were explained to the patient and/or caregiver.  Consent for the procedure was obtained and is signed in the bedside chart  Anesthesia Topical only with 1% lidocaine    Time Out Verified patient identification, verified procedure, site/side was marked, verified correct patient position, special equipment/implants available, medications/allergies/relevant history reviewed, required imaging and test results available.   Sterile Technique Maximal sterile technique including full sterile barrier drape, hand hygiene, sterile gown, sterile gloves, mask, hair covering, sterile ultrasound probe cover (if used).   Procedure Description Ultrasound not used to identify appropriate pleural anatomy for placement and overlying skin marked. Area of placement cleaned and draped in sterile fashion.  A 20 French chest tube was placed into the right pleural space using trochar. Appropriate return of air was obtained.  The tube was connected to atrium and placed on -20 cm H2O wall suction.   Complications/Tolerance None; patient tolerated the procedure well. Chest X-ray is ordered to verify placement.- film reviewed , almost complete inflation - will place to suction    EBL Minimal  Specimen(s) none

## 2020-08-15 NOTE — Progress Notes (Signed)
Pt is transferring out of the 4E unit for CT scan and 2 views chest X-ray. No distress on 2 LPM of NCL.   Dr. Cornelius Moras notified and agreed that Pt can tranfer out of the unit with chest tube under water seal.  Filiberto Pinks, RN

## 2020-08-16 ENCOUNTER — Encounter (HOSPITAL_COMMUNITY)
Admission: EM | Disposition: A | Discharge status: Home / Self Care | Payer: Self-pay | Source: Home / Self Care | Attending: Thoracic Surgery (Cardiothoracic Vascular Surgery)

## 2020-08-16 ENCOUNTER — Inpatient Hospital Stay (HOSPITAL_COMMUNITY): Payer: Medicare Other | Admitting: Certified Registered Nurse Anesthetist

## 2020-08-16 ENCOUNTER — Encounter (HOSPITAL_COMMUNITY): Payer: Self-pay | Admitting: Thoracic Surgery (Cardiothoracic Vascular Surgery)

## 2020-08-16 ENCOUNTER — Inpatient Hospital Stay (HOSPITAL_COMMUNITY): Payer: Medicare Other

## 2020-08-16 DIAGNOSIS — J439 Emphysema, unspecified: Secondary | ICD-10-CM | POA: Diagnosis present

## 2020-08-16 HISTORY — PX: VIDEO ASSISTED THORACOSCOPY: SHX5073

## 2020-08-16 HISTORY — PX: STAPLING OF BLEBS: SHX6429

## 2020-08-16 HISTORY — PX: INTERCOSTAL NERVE BLOCK: SHX5021

## 2020-08-16 LAB — BLOOD GAS, ARTERIAL
Acid-Base Excess: 1.6 mmol/L (ref 0.0–2.0)
Bicarbonate: 24.9 mmol/L (ref 20.0–28.0)
Drawn by: 60057
FIO2: 21
O2 Saturation: 86.9 %
Patient temperature: 37
pCO2 arterial: 34.2 mmHg (ref 32.0–48.0)
pH, Arterial: 7.476 — ABNORMAL HIGH (ref 7.350–7.450)
pO2, Arterial: 48.8 mmHg — ABNORMAL LOW (ref 83.0–108.0)

## 2020-08-16 LAB — SURGICAL PCR SCREEN
MRSA, PCR: NEGATIVE
Staphylococcus aureus: NEGATIVE

## 2020-08-16 SURGERY — VIDEO ASSISTED THORACOSCOPY
Anesthesia: General | Site: Chest | Laterality: Right

## 2020-08-16 MED ORDER — SODIUM CHLORIDE FLUSH 0.9 % IV SOLN
INTRAVENOUS | Status: DC | PRN
  Administered 2020-08-16: 85 mL

## 2020-08-16 MED ORDER — PROMETHAZINE HCL 25 MG/ML IJ SOLN: 6.2500 mg | INTRAMUSCULAR | Status: DC | PRN

## 2020-08-16 MED ORDER — DEXAMETHASONE SODIUM PHOSPHATE 10 MG/ML IJ SOLN
INTRAMUSCULAR | Status: DC | PRN
  Administered 2020-08-16: 5 mg via INTRAVENOUS

## 2020-08-16 MED ORDER — ACETAMINOPHEN 160 MG/5ML PO SOLN: 1000.0000 mg | Freq: Four times a day (QID) | ORAL | Status: DC

## 2020-08-16 MED ORDER — MEPERIDINE HCL 25 MG/ML IJ SOLN: 6.2500 mg | INTRAMUSCULAR | Status: DC | PRN

## 2020-08-16 MED ORDER — OXYCODONE HCL 5 MG/5ML PO SOLN: 5.0000 mg | Freq: Once | ORAL | Status: DC | PRN

## 2020-08-16 MED ORDER — BUPIVACAINE LIPOSOME 1.3 % IJ SUSP
20.0000 mL | Freq: Once | INTRAMUSCULAR | Status: DC
  Filled 2020-08-16: qty 20

## 2020-08-16 MED ORDER — LIDOCAINE 2% (20 MG/ML) 5 ML SYRINGE
INTRAMUSCULAR | Status: AC
  Filled 2020-08-16: qty 5

## 2020-08-16 MED ORDER — ONDANSETRON HCL 4 MG/2ML IJ SOLN
INTRAMUSCULAR | Status: DC | PRN
  Administered 2020-08-16: 4 mg via INTRAVENOUS

## 2020-08-16 MED ORDER — LACTATED RINGERS IV SOLN: INTRAVENOUS | Status: DC

## 2020-08-16 MED ORDER — ONDANSETRON HCL 4 MG/2ML IJ SOLN
INTRAMUSCULAR | Status: AC
  Filled 2020-08-16: qty 2

## 2020-08-16 MED ORDER — LIDOCAINE 2% (20 MG/ML) 5 ML SYRINGE
INTRAMUSCULAR | Status: DC | PRN
  Administered 2020-08-16: 20 mg via INTRAVENOUS

## 2020-08-16 MED ORDER — PROPOFOL 10 MG/ML IV BOLUS
INTRAVENOUS | Status: DC | PRN
  Administered 2020-08-16: 120 mg via INTRAVENOUS

## 2020-08-16 MED ORDER — HYDROMORPHONE HCL 1 MG/ML IJ SOLN
INTRAMUSCULAR | Status: AC
  Filled 2020-08-16: qty 1

## 2020-08-16 MED ORDER — PROPOFOL 10 MG/ML IV BOLUS
INTRAVENOUS | Status: AC
  Filled 2020-08-16: qty 20

## 2020-08-16 MED ORDER — PHENYLEPHRINE HCL-NACL 10-0.9 MG/250ML-% IV SOLN
INTRAVENOUS | Status: AC
  Filled 2020-08-16: qty 250

## 2020-08-16 MED ORDER — UMECLIDINIUM-VILANTEROL 62.5-25 MCG/INH IN AEPB
1.0000 | INHALATION_SPRAY | Freq: Every day | RESPIRATORY_TRACT | Status: DC
  Administered 2020-08-17 – 2020-08-21 (×5): 1 via RESPIRATORY_TRACT
  Filled 2020-08-16: qty 14

## 2020-08-16 MED ORDER — LACTATED RINGERS IV SOLN: INTRAVENOUS | Status: DC | PRN

## 2020-08-16 MED ORDER — MIDAZOLAM HCL 2 MG/2ML IJ SOLN
INTRAMUSCULAR | Status: DC | PRN
  Administered 2020-08-16: 2 mg via INTRAVENOUS

## 2020-08-16 MED ORDER — DEXAMETHASONE SODIUM PHOSPHATE 10 MG/ML IJ SOLN
INTRAMUSCULAR | Status: AC
  Filled 2020-08-16: qty 1

## 2020-08-16 MED ORDER — FENTANYL CITRATE (PF) 250 MCG/5ML IJ SOLN
INTRAMUSCULAR | Status: AC
  Filled 2020-08-16: qty 5

## 2020-08-16 MED ORDER — OXYCODONE HCL 5 MG PO TABS: 5.0000 mg | ORAL_TABLET | Freq: Once | ORAL | Status: DC | PRN

## 2020-08-16 MED ORDER — FLUTICASONE PROPIONATE 50 MCG/ACT NA SUSP
1.0000 | Freq: Every day | NASAL | Status: DC | PRN
  Filled 2020-08-16: qty 16

## 2020-08-16 MED ORDER — CHLORHEXIDINE GLUCONATE CLOTH 2 % EX PADS
6.0000 | MEDICATED_PAD | Freq: Every day | CUTANEOUS | Status: DC
  Administered 2020-08-17: 6 via TOPICAL

## 2020-08-16 MED ORDER — MELATONIN 5 MG PO TABS
10.0000 mg | ORAL_TABLET | Freq: Every day | ORAL | Status: DC
  Administered 2020-08-16 – 2020-08-20 (×5): 10 mg via ORAL
  Filled 2020-08-16 (×5): qty 2

## 2020-08-16 MED ORDER — SUGAMMADEX SODIUM 200 MG/2ML IV SOLN
INTRAVENOUS | Status: DC | PRN
  Administered 2020-08-16: 200 mg via INTRAVENOUS

## 2020-08-16 MED ORDER — SENNOSIDES-DOCUSATE SODIUM 8.6-50 MG PO TABS
1.0000 | ORAL_TABLET | Freq: Every day | ORAL | Status: DC
  Administered 2020-08-16 – 2020-08-17 (×2): 1 via ORAL
  Filled 2020-08-16 (×4): qty 1

## 2020-08-16 MED ORDER — SUCCINYLCHOLINE CHLORIDE 200 MG/10ML IV SOSY
PREFILLED_SYRINGE | INTRAVENOUS | Status: DC | PRN
  Administered 2020-08-16: 160 mg via INTRAVENOUS

## 2020-08-16 MED ORDER — ROCURONIUM BROMIDE 10 MG/ML (PF) SYRINGE
PREFILLED_SYRINGE | INTRAVENOUS | Status: AC
  Filled 2020-08-16: qty 10

## 2020-08-16 MED ORDER — PHENYLEPHRINE HCL-NACL 10-0.9 MG/250ML-% IV SOLN
INTRAVENOUS | Status: DC | PRN
  Administered 2020-08-16: 50 ug/min via INTRAVENOUS

## 2020-08-16 MED ORDER — FENTANYL CITRATE (PF) 250 MCG/5ML IJ SOLN
INTRAMUSCULAR | Status: DC | PRN
Start: 2020-08-16 — End: 2020-08-16
  Administered 2020-08-16: 250 ug via INTRAVENOUS

## 2020-08-16 MED ORDER — ROCURONIUM BROMIDE 10 MG/ML (PF) SYRINGE
PREFILLED_SYRINGE | INTRAVENOUS | Status: DC | PRN
  Administered 2020-08-16: 30 mg via INTRAVENOUS
  Administered 2020-08-16: 50 mg via INTRAVENOUS

## 2020-08-16 MED ORDER — BISACODYL 5 MG PO TBEC
10.0000 mg | DELAYED_RELEASE_TABLET | Freq: Every day | ORAL | Status: DC
  Administered 2020-08-17 – 2020-08-18 (×2): 10 mg via ORAL
  Filled 2020-08-16 (×4): qty 2

## 2020-08-16 MED ORDER — ACETAMINOPHEN 500 MG PO TABS
1000.0000 mg | ORAL_TABLET | Freq: Four times a day (QID) | ORAL | Status: DC
  Administered 2020-08-16 – 2020-08-21 (×18): 1000 mg via ORAL
  Filled 2020-08-16 (×19): qty 2

## 2020-08-16 MED ORDER — 0.9 % SODIUM CHLORIDE (POUR BTL) OPTIME
TOPICAL | Status: DC | PRN
  Administered 2020-08-16: 2000 mL

## 2020-08-16 MED ORDER — CHLORHEXIDINE GLUCONATE 0.12 % MT SOLN
OROMUCOSAL | Status: AC
  Administered 2020-08-16: 15 mL via OROMUCOSAL
  Filled 2020-08-16: qty 15

## 2020-08-16 MED ORDER — HYDROMORPHONE HCL 1 MG/ML IJ SOLN
0.2500 mg | INTRAMUSCULAR | Status: DC | PRN
  Administered 2020-08-16 (×2): 0.5 mg via INTRAVENOUS

## 2020-08-16 MED ORDER — CHLORHEXIDINE GLUCONATE 0.12 % MT SOLN: 15.0000 mL | Freq: Once | OROMUCOSAL | Status: AC

## 2020-08-16 MED ORDER — MIDAZOLAM HCL 2 MG/2ML IJ SOLN
INTRAMUSCULAR | Status: AC
  Filled 2020-08-16: qty 2

## 2020-08-16 MED ORDER — SODIUM CHLORIDE 0.9 % IV SOLN: INTRAVENOUS | Status: DC | PRN

## 2020-08-16 MED ORDER — ADULT MULTIVITAMIN W/MINERALS CH
1.0000 | ORAL_TABLET | Freq: Every day | ORAL | Status: DC
  Administered 2020-08-17 – 2020-08-21 (×5): 1 via ORAL
  Filled 2020-08-16 (×5): qty 1

## 2020-08-16 MED ORDER — MIDAZOLAM HCL 2 MG/2ML IJ SOLN: 0.5000 mg | Freq: Once | INTRAMUSCULAR | Status: DC | PRN

## 2020-08-16 MED ORDER — PHENYLEPHRINE 40 MCG/ML (10ML) SYRINGE FOR IV PUSH (FOR BLOOD PRESSURE SUPPORT)
PREFILLED_SYRINGE | INTRAVENOUS | Status: DC | PRN
  Administered 2020-08-16: 160 ug via INTRAVENOUS
  Administered 2020-08-16: 120 ug via INTRAVENOUS
  Administered 2020-08-16: 80 ug via INTRAVENOUS

## 2020-08-16 MED ORDER — BUPIVACAINE HCL (PF) 0.5 % IJ SOLN
INTRAMUSCULAR | Status: AC
  Filled 2020-08-16: qty 30

## 2020-08-16 SURGICAL SUPPLY — 85 items
APPLICATOR COTTON TIP 6 STRL (MISCELLANEOUS) IMPLANT
APPLICATOR COTTON TIP 6IN STRL (MISCELLANEOUS)
APPLIER CLIP ROT 10 11.4 M/L (STAPLE)
BLADE CLIPPER SURG (BLADE) IMPLANT
CANISTER SUCT 3000ML PPV (MISCELLANEOUS) ×3 IMPLANT
CATH THORACIC 28FR RT ANG (CATHETERS) IMPLANT
CATH THORACIC 36FR (CATHETERS) IMPLANT
CATH THORACIC 36FR RT ANG (CATHETERS) IMPLANT
CLIP APPLIE ROT 10 11.4 M/L (STAPLE) IMPLANT
CLIP VESOCCLUDE MED 6/CT (CLIP) ×3 IMPLANT
CNTNR URN SCR LID CUP LEK RST (MISCELLANEOUS) ×2 IMPLANT
CONN Y 3/8X3/8X3/8  BEN (MISCELLANEOUS)
CONN Y 3/8X3/8X3/8 BEN (MISCELLANEOUS) IMPLANT
CONT SPEC 4OZ STRL OR WHT (MISCELLANEOUS) ×3
COVER SURGICAL LIGHT HANDLE (MISCELLANEOUS) ×3 IMPLANT
DERMABOND ADVANCED (GAUZE/BANDAGES/DRESSINGS) ×1
DERMABOND ADVANCED .7 DNX12 (GAUZE/BANDAGES/DRESSINGS) ×2 IMPLANT
DRAIN CHANNEL 28F RND 3/8 FF (WOUND CARE) ×3 IMPLANT
DRAIN CHANNEL 32F RND 10.7 FF (WOUND CARE) IMPLANT
DRAPE CV SPLIT W-CLR ANES SCRN (DRAPES) ×3 IMPLANT
DRAPE ORTHO SPLIT 77X108 STRL (DRAPES)
DRAPE SLUSH/WARMER DISC (DRAPES) ×3 IMPLANT
DRAPE SURG ORHT 6 SPLT 77X108 (DRAPES) IMPLANT
ELECT BLADE 6.5 EXT (BLADE) IMPLANT
ELECT REM PT RETURN 9FT ADLT (ELECTROSURGICAL) ×3
ELECTRODE REM PT RTRN 9FT ADLT (ELECTROSURGICAL) ×2 IMPLANT
GAUZE SPONGE 4X4 12PLY STRL (GAUZE/BANDAGES/DRESSINGS) ×3 IMPLANT
GAUZE SPONGE 4X4 12PLY STRL LF (GAUZE/BANDAGES/DRESSINGS) ×3 IMPLANT
GLOVE SURG SIGNA 7.5 PF LTX (GLOVE) ×6 IMPLANT
GLOVE TRIUMPH SURG SIZE 7.5 (KITS) ×3 IMPLANT
GOWN STRL REUS W/ TWL LRG LVL3 (GOWN DISPOSABLE) ×4 IMPLANT
GOWN STRL REUS W/ TWL XL LVL3 (GOWN DISPOSABLE) ×2 IMPLANT
GOWN STRL REUS W/TWL LRG LVL3 (GOWN DISPOSABLE) ×6
GOWN STRL REUS W/TWL XL LVL3 (GOWN DISPOSABLE) ×3
HANDLE STAPLE ENDO GIA SHORT (STAPLE) ×1
HEMOSTAT SURGICEL 2X14 (HEMOSTASIS) ×3 IMPLANT
IV CATH 22GX1 FEP (IV SOLUTION) IMPLANT
KIT BASIN OR (CUSTOM PROCEDURE TRAY) ×3 IMPLANT
KIT SUCTION CATH 14FR (SUCTIONS) IMPLANT
KIT TURNOVER KIT B (KITS) ×3 IMPLANT
NEEDLE HYPO 25GX1X1/2 BEV (NEEDLE) ×3 IMPLANT
NS IRRIG 1000ML POUR BTL (IV SOLUTION) ×6 IMPLANT
PACK CHEST (CUSTOM PROCEDURE TRAY) ×3 IMPLANT
PAD ARMBOARD 7.5X6 YLW CONV (MISCELLANEOUS) ×6 IMPLANT
POUCH ENDO CATCH II 15MM (MISCELLANEOUS) IMPLANT
POUCH SPECIMEN RETRIEVAL 10MM (ENDOMECHANICALS) IMPLANT
RELOAD TRI 45 ART MED THCK BLK (STAPLE) ×12 IMPLANT
RELOAD TRI 45 ART MED THCK PUR (STAPLE) ×12 IMPLANT
SEALANT PROGEL (MISCELLANEOUS) IMPLANT
SEALANT SURG COSEAL 4ML (VASCULAR PRODUCTS) IMPLANT
SEALANT SURG COSEAL 8ML (VASCULAR PRODUCTS) IMPLANT
SOL ANTI FOG 6CC (MISCELLANEOUS) ×2 IMPLANT
SOLUTION ANTI FOG 6CC (MISCELLANEOUS) ×1
SPECIMEN JAR MEDIUM (MISCELLANEOUS) ×3 IMPLANT
SPONGE INTESTINAL PEANUT (DISPOSABLE) IMPLANT
SPONGE TONSIL TAPE 1 RFD (DISPOSABLE) IMPLANT
STAPLER ENDO GIA 12MM SHORT (STAPLE) ×2 IMPLANT
STOPCOCK 4 WAY LG BORE MALE ST (IV SETS) IMPLANT
SUT PROLENE 4 0 RB 1 (SUTURE)
SUT PROLENE 4-0 RB1 .5 CRCL 36 (SUTURE) IMPLANT
SUT SILK  1 MH (SUTURE) ×6
SUT SILK 1 MH (SUTURE) ×4 IMPLANT
SUT SILK 2 0SH CR/8 30 (SUTURE) IMPLANT
SUT SILK 3 0SH CR/8 30 (SUTURE) IMPLANT
SUT VIC AB 1 CTX 36 (SUTURE) ×3
SUT VIC AB 1 CTX36XBRD ANBCTR (SUTURE) ×2 IMPLANT
SUT VIC AB 2-0 CTX 36 (SUTURE) ×3 IMPLANT
SUT VIC AB 2-0 UR6 27 (SUTURE) IMPLANT
SUT VIC AB 3-0 MH 27 (SUTURE) IMPLANT
SUT VIC AB 3-0 X1 27 (SUTURE) ×3 IMPLANT
SUT VICRYL 2 TP 1 (SUTURE) IMPLANT
SYR 10ML LL (SYRINGE) ×3 IMPLANT
SYR 20ML LL LF (SYRINGE) ×3 IMPLANT
SYR 50ML LL SCALE MARK (SYRINGE) ×3 IMPLANT
SYSTEM SAHARA CHEST DRAIN ATS (WOUND CARE) ×3 IMPLANT
TAPE CLOTH 4X10 WHT NS (GAUZE/BANDAGES/DRESSINGS) ×3 IMPLANT
TAPE CLOTH SURG 4X10 WHT LF (GAUZE/BANDAGES/DRESSINGS) ×3 IMPLANT
TIP APPLICATOR SPRAY EXTEND 16 (VASCULAR PRODUCTS) IMPLANT
TOWEL GREEN STERILE (TOWEL DISPOSABLE) ×3 IMPLANT
TOWEL GREEN STERILE FF (TOWEL DISPOSABLE) ×3 IMPLANT
TRAY FOLEY MTR SLVR 16FR STAT (SET/KITS/TRAYS/PACK) ×3 IMPLANT
TROCAR XCEL BLADELESS 5X75MML (TROCAR) ×3 IMPLANT
TROCAR XCEL NON-BLD 5MMX100MML (ENDOMECHANICALS) IMPLANT
TUBING EXTENTION W/L.L. (IV SETS) IMPLANT
WATER STERILE IRR 1000ML POUR (IV SOLUTION) ×6 IMPLANT

## 2020-08-16 NOTE — Anesthesia Procedure Notes (Signed)
Arterial Line Insertion Start/End10/05/2020 11:15 AM, 08/16/2020 11:18 AM Performed by: Lelon Perla, CRNA, CRNA  Patient location: Pre-op. Preanesthetic checklist: patient identified, IV checked, site marked, risks and benefits discussed, surgical consent, monitors and equipment checked, pre-op evaluation, timeout performed and anesthesia consent Lidocaine 1% used for infiltration radial was placed Catheter size: 20 Fr Hand hygiene performed  and maximum sterile barriers used   Attempts: 1 Procedure performed without using ultrasound guided technique. Following insertion, dressing applied. Post procedure assessment: normal and unchanged  Patient tolerated the procedure well with no immediate complications.

## 2020-08-16 NOTE — Transfer of Care (Signed)
Immediate Anesthesia Transfer of Care Note  Patient: Jay Mitchell  Procedure(s) Performed: VIDEO ASSISTED THORACOSCOPY (Right Chest) STAPLING OF BLEBS (Right ) INTERCOSTAL NERVE BLOCK (Right )  Patient Location: PACU  Anesthesia Type:General  Level of Consciousness: awake, alert  and oriented  Airway & Oxygen Therapy: Patient Spontanous Breathing and Patient connected to face mask oxygen  Post-op Assessment: Report given to RN and Post -op Vital signs reviewed and stable  Post vital signs: Reviewed and stable  Last Vitals:  Vitals Value Taken Time  BP 144/84 08/16/20 1319  Temp    Pulse 92 08/16/20 1321  Resp 18 08/16/20 1321  SpO2 95 % 08/16/20 1321  Vitals shown include unvalidated device data.  Last Pain:  Vitals:   08/16/20 0827  TempSrc: Oral  PainSc: 6          Complications: No complications documented.

## 2020-08-16 NOTE — Brief Op Note (Addendum)
08/15/2020 - 08/16/2020  12:49 PM  PATIENT:  Jay Mitchell  64 y.o. male  PRE-OPERATIVE DIAGNOSIS:  Recurrent Right Spontaneous Pneumothorax  POST-OPERATIVE DIAGNOSIS:  Recurrent Right Spontaneous Pneumothorax  PROCEDURE:  Procedure(s): VIDEO ASSISTED THORACOSCOPY (Right) STAPLING OF BLEBS (Right) INTERCOSTAL NERVE BLOCK (Right)  SURGEON:  Surgeon(s) and Role:    * Loreli Slot, MD - Primary  PHYSICIAN ASSISTANT: WAYNE GOLD PA-C   ANESTHESIA:   general  EBL:  50 mL   BLOOD ADMINISTERED:none  DRAINS: (1) Blake drain(s) in the RIGHT HEMITHORAX   LOCAL MEDICATIONS USED:  EXPAREL  SPECIMEN:  Source of Specimen:  RUL BULLAE AND BLEBS WEDHE RESECTIONSX 2  DISPOSITION OF SPECIMEN:  PATHOLOGY  COUNTS:  YES  TOURNIQUET:  * No tourniquets in log *  DICTATION: .Other Dictation: Dictation Number ENDING  PLAN OF CARE: Admit to inpatient   PATIENT DISPOSITION:  PACU - hemodynamically stable.   Delay start of Pharmacological VTE agent (>24hrs) due to surgical blood loss or risk of bleeding: yes  COMPLICATIONS: NO KNOWN

## 2020-08-16 NOTE — Interval H&P Note (Signed)
History and Physical Interval Note:  08/16/2020 10:43 AM  Jay Mitchell  has presented today for surgery, with the diagnosis of RIGHT PTX.  The various methods of treatment have been discussed with the patient and family. After consideration of risks, benefits and other options for treatment, the patient has consented to  Procedure(s): VIDEO ASSISTED THORACOSCOPY (Right) STAPLING OF BLEBS (Right) as a surgical intervention.  The patient's history has been reviewed, patient examined, no change in status, stable for surgery.  I have reviewed the patient's chart and labs.  Questions were answered to the patient's satisfaction.     Loreli Slot

## 2020-08-16 NOTE — Anesthesia Preprocedure Evaluation (Signed)
Anesthesia Evaluation  Patient identified by MRN, date of birth, ID band Patient awake    Reviewed: Allergy & Precautions, NPO status , Patient's Chart, lab work & pertinent test results  History of Anesthesia Complications Negative for: history of anesthetic complications  Airway Mallampati: II  TM Distance: >3 FB     Dental  (+) Dental Advisory Given, Teeth Intact   Pulmonary COPD (nocturnal O2),  COPD inhaler and oxygen dependent, former smoker,  08/15/2020 SARS coronavirus NEG   breath sounds clear to auscultation       Cardiovascular negative cardio ROS   Rhythm:Regular Rate:Normal     Neuro/Psych negative neurological ROS     GI/Hepatic Neg liver ROS, GERD  Medicated and Controlled,  Endo/Other  negative endocrine ROS  Renal/GU negative Renal ROS     Musculoskeletal   Abdominal   Peds  Hematology negative hematology ROS (+)   Anesthesia Other Findings   Reproductive/Obstetrics                             Anesthesia Physical Anesthesia Plan  ASA: III  Anesthesia Plan: General   Post-op Pain Management:    Induction: Intravenous  PONV Risk Score and Plan: 2 and Ondansetron and Dexamethasone  Airway Management Planned: Oral ETT and Double Lumen EBT  Additional Equipment: Arterial line  Intra-op Plan:   Post-operative Plan: Extubation in OR  Informed Consent: I have reviewed the patients History and Physical, chart, labs and discussed the procedure including the risks, benefits and alternatives for the proposed anesthesia with the patient or authorized representative who has indicated his/her understanding and acceptance.     Dental advisory given  Plan Discussed with: CRNA and Surgeon  Anesthesia Plan Comments:         Anesthesia Quick Evaluation

## 2020-08-16 NOTE — Progress Notes (Signed)
Pt arrived back to 4e from PACU. Pt orientted x4. Pt has new right radial a-line that was zeroed and is reading well with good pleth. Pt also has foley. Vitals obtained and stable. Telemetry box placed and CCMD notified. Pt with pain level of 6/10. Oxycodone given per PRN order.

## 2020-08-16 NOTE — Op Note (Signed)
Jay Mitchell, LAMA MEDICAL RECORD [id number] ACCOUNT [id number] DATE OF BIRTH:January 25, 1956 FACILITY: [facility] LOCATION: [facility] PHYSICIAN:Kaleisha Bhargava Lars Pinks, MD  OPERATIVE REPORT  DATE OF PROCEDURE:  08/16/2020  PREOPERATIVE DIAGNOSIS:  Recurrent right spontaneous pneumothorax.  POSTOPERATIVE DIAGNOSIS:  Recurrent right spontaneous pneumothorax.  PROCEDURE:   1. Right video-assisted thoracoscopy. 2.  Bleb resection, right upper lobe x2.   3.  Intercostal nerve blocks at levels 3 through 10.  SURGEON:  Charlett Lango, MD  ASSISTANT:  Gershon Crane, [facility]  ANESTHESIA:  General.  FINDINGS:  Large ruptured bleb along the inferior aspect of right upper lobe. Apex of right lung was essentially a series of confluent blebs.  CLINICAL NOTE:  Mr. Jay Mitchell with a 64 year old gentleman with a history of tobacco abuse and COPD.  He had had a VATS for a left spontaneous pneumothorax several years ago.  He presented with a right pneumothorax, which was treated with a chest tube.   Approximately 2 days after discharge, he presented back with recurrent right spontaneous pneumothorax.  A chest tube was placed.  He was advised to undergo surgery for resection of the blebs.  The indications, risks, benefits, and alternatives were  discussed in detail with the patient.  He understood and accepted the risks and agreed to proceed.  OPERATIVE NOTE:  Mr. [name] was brought to the preoperative holding area on 08/16/2020.  Anesthesia established intravenous access and placed an arterial blood pressure monitoring line.  He was taken to the operating room, anesthetized and intubated with a  double lumen endotracheal tube.  Intravenous antibiotics were administered.  A Foley catheter was placed.  Sequential compression devices were placed on the calves for DVT prophylaxis.  He was placed in a left lateral decubitus position.  Single-lung  ventilation of the left lung was initiated.  The right chest tube was removed.   The right chest was then prepped and draped in the usual sterile fashion.  A timeout was performed.  A solution containing 20 mL of liposomal bupivacaine, 30 mL of 0.5% bupivacaine and 50 mL of saline was prepared.  This was used for local at the incisions, as well as for the intercostal nerve blocks.  An incision was made in  the 8th interspace and a 5 mm port was inserted.  The thoracoscope was advanced into the chest.  There was a large complex of blebs which appeared recently ruptured in the inferior aspect of the right upper lobe.  The entire right upper lobe was severely  emphysematous.  The upper half of the right upper lobe was essentially a series of confluent blebs.  Intercostal nerve blocks were performed by injecting 10 mL of the bupivacaine solution into a subpleural plane at each level from the 3rd to the 10th  interspace.  A 4 cm working incision was made in the 4th interspace anterolaterally.  No rib spreading was performed during the procedure.  The large ruptured bleb was grasped and stapled across its base in the area of relatively better lung tissue.   Pericardial reinforcement was used on all staple lines.  The Covidien stapler was used.  The specimen was sent for permanent pathology.  Next, the upper portion of the upper lobe was removed, again with sequential firings of the Covidien stapler using  reinforced staple lines.  This specimen likewise was sent for permanent pathology.  The pleura was lightly abraded.  A 28-French Blake drain was placed through the original port incision and directed to the apex.  It was secured  to the skin with #1 silk  suture.  Dual-lung ventilation was resumed.  The working incision was closed with a 0 Vicryl fascial suture.  The subcutaneous tissue and skin were closed in standard fashion.  Dermabond was applied.  The chest tube was placed to a Pleur-evac on suction.   The patient was extubated in the operating room and taken to the Postanesthetic Care  Unit in good condition.  All sponge, needle and instrument counts were correct at the end of the procedure.  VN/NUANCE  D:08/16/2020 T:08/16/2020 JOB:[id number]

## 2020-08-16 NOTE — Anesthesia Procedure Notes (Signed)
Procedure Name: Intubation Date/Time: 08/16/2020 11:38 AM Performed by: Lelon Perla, CRNA Pre-anesthesia Checklist: Patient identified, Emergency Drugs available, Suction available and Patient being monitored Patient Re-evaluated:Patient Re-evaluated prior to induction Oxygen Delivery Method: Circle system utilized Preoxygenation: Pre-oxygenation with 100% oxygen Induction Type: IV induction Ventilation: Mask ventilation with difficulty Laryngoscope Size: Glidescope and 3 Grade View: Grade I Tube type: Oral Endobronchial tube: Left and Double lumen EBT and 39 Fr Number of attempts: 1 Airway Equipment and Method: Oral airway,  Stylet and Video-laryngoscopy Placement Confirmation: ETT inserted through vocal cords under direct vision,  positive ETCO2 and breath sounds checked- equal and bilateral Tube secured with: Tape Dental Injury: Teeth and Oropharynx as per pre-operative assessment  Future Recommendations: Recommend- induction with short-acting agent, and alternative techniques readily available

## 2020-08-16 NOTE — Anesthesia Postprocedure Evaluation (Signed)
Anesthesia Post Note  Patient: Jay Mitchell  Procedure(s) Performed: VIDEO ASSISTED THORACOSCOPY (Right Chest) STAPLING OF BLEBS (Right ) INTERCOSTAL NERVE BLOCK (Right )     Patient location during evaluation: PACU Anesthesia Type: General Level of consciousness: awake and alert Pain management: pain level controlled Vital Signs Assessment: post-procedure vital signs reviewed and stable Respiratory status: spontaneous breathing, nonlabored ventilation, respiratory function stable and patient connected to nasal cannula oxygen Cardiovascular status: blood pressure returned to baseline and stable Postop Assessment: no apparent nausea or vomiting Anesthetic complications: no   No complications documented.  Last Vitals:  Vitals:   08/16/20 1430 08/16/20 1458  BP:  128/77  Pulse: 88 89  Resp: 14 15  Temp: 36.4 C 36.8 C  SpO2: 95% 90%    Last Pain:  Vitals:   08/16/20 1458  TempSrc: Oral  PainSc: 6                  Danuel Felicetti,E. Erla Bacchi

## 2020-08-16 NOTE — Progress Notes (Signed)
Pt came back from CT and 2 views chest xray with no distress. CT chest result presented significant increase in the degree of right pneumothorax with near complete collapse of the right lung with mediastinal shift to the left when compared with the previous exam.   Notified Dr. Cornelius Moras, on call provider. No new order received. Continue on - 20 cmH2o of suction with persistent air leak in the chest tube system. No subcutaneous emphysema detected.On 3 LPM of O2 NCL, SPO2 92-95%, RR 16-25, HR 80s, BP within normal limits.  We will continue to monitor.  Filiberto Pinks, RN

## 2020-08-16 NOTE — Progress Notes (Signed)
      [address]       [name] [address]             [phone]      I discussed surgery with the patient who was very familiar. He understands that he will be going to the OR this afternoon for right VATS, stapling of blebs. All questions were answered to the patient/s satisfaction.    Jari Favre, PA-C

## 2020-08-17 ENCOUNTER — Inpatient Hospital Stay (HOSPITAL_COMMUNITY): Payer: Medicare Other

## 2020-08-17 ENCOUNTER — Encounter (HOSPITAL_COMMUNITY): Payer: Self-pay | Admitting: Thoracic Surgery (Cardiothoracic Vascular Surgery)

## 2020-08-17 LAB — BASIC METABOLIC PANEL
Anion gap: 8 (ref 5–15)
BUN: 7 mg/dL — ABNORMAL LOW (ref 8–23)
CO2: 24 mmol/L (ref 22–32)
Calcium: 8.8 mg/dL — ABNORMAL LOW (ref 8.9–10.3)
Chloride: 102 mmol/L (ref 98–111)
Creatinine, Ser: 0.8 mg/dL (ref 0.61–1.24)
GFR calc non Af Amer: >60 mL/min (ref >60)
Glucose, Bld: 118 mg/dL — ABNORMAL HIGH (ref 70–99)
Potassium: 4.6 mmol/L (ref 3.5–5.1)
Sodium: 134 mmol/L — ABNORMAL LOW (ref 135–145)

## 2020-08-17 LAB — CBC
HCT: 41.8 % (ref 39.0–52.0)
Hemoglobin: 13.6 g/dL (ref 13.0–17.0)
MCH: 29.6 pg (ref 26.0–34.0)
MCHC: 32.5 g/dL (ref 30.0–36.0)
MCV: 90.9 fL (ref 80.0–100.0)
Platelets: 315 10*3/uL (ref 150–400)
RBC: 4.6 MIL/uL (ref 4.22–5.81)
RDW: 12.6 % (ref 11.5–15.5)
WBC: 18.6 10*3/uL — ABNORMAL HIGH (ref 4.0–10.5)
nRBC: 0 % (ref 0.0–0.2)

## 2020-08-17 LAB — SURGICAL PATHOLOGY

## 2020-08-17 NOTE — Progress Notes (Addendum)
A line removed at this time. Vaseline gauze and pressure applied. Patient tolerated well.   Foley catheter removed. Will continue to monitor.

## 2020-08-17 NOTE — Discharge Summary (Signed)
[address]       [place] [address]             [phone]      Physician Discharge Summary  Patient ID: Jay Mitchell MRN: [id number] DOB/AGE: 65-Oct-1957 65 y.o.  Admit date: 08/15/2020 Discharge date: 08/21/2020  Admission Diagnoses:  Patient Active Problem List   Diagnosis Date Noted   Lung bullae (HCC) 08/16/2020   Recurrent spontaneous pneumothorax 08/15/2020   Spontaneous pneumothorax 08/10/2020   Hyperglycemia 02/15/2018   PNA (pneumonia) 08/17/2017   Acute bronchitis 08/03/2017   Rhinitis, chronic 05/12/2017   Lung blebs (HCC) 09/17/2016   Spontaneous tension pneumothorax    GERD (gastroesophageal reflux disease)    COPD with chronic bronchitis and emphysema (HCC)    HYPERCHOLESTEROLEMIA 08/31/2007    Discharge Diagnoses:  Active Problems:   Recurrent spontaneous pneumothorax   Lung bullae [facility])   Discharged Condition: good  HPI:   This is a 64 year old male with a past medical history of COPD, remote tobacco abuse (quit 2004), and  previous left spontaneous pneumothorax (had left VATS, multiple blebectomies of LUL, apical pleurectomy, and mechanical pleurodesis Nov 2017) who was discharged on Monday after being admitted for a right spontaneous pneumothorax that required a chest tube. Patient was using his inhaler earlier this am when he heard a "popping" sound. He became more short of breath afterward. EMS was summonsed and he presented to [facility] ED. Chest x ray showed a very large right pneumothorax, possible slight leftward mediastinal shift. Dr. Tyrone Sage placed a right chest tube. Follow up chest x ray showed smaller right apical pneumothorax, no tension component, with atelectatic changes  Hospital Course:   Mr. Becraft underwent a right VATs with bleb resection with Dr. Dorris Fetch. He tolerated the procedure well and was transferred to the cardiac telemetry floor in stable condition. POD 1 we kept his chest tube to  suction since he had a small residual apical pneumo and a small air leak. The air leak subsided by the next day and the tube was placed to water seal. The CXR remained stable. Due to his known severe bullous disease we decided to leave the tube on water seal an extra day before removing. POD 3 he did not have a air leak over the weekend and remained on water seal . On POD 4 we removed the chest tube and the final CXR showed: trace apical space. The patient is medically stable.  He is on oxygen at home and has not required an increase.  He is stable for discharge home today.  Consults: None  Significant Diagnostic Studies:   CLINICAL DATA:  Chest tube. Right-sided pneumothorax. Evaluation for lung bullae. Interstitial process.  EXAM: PORTABLE CHEST 1 VIEW  COMPARISON:  08/16/2020.  FINDINGS: Right chest tube in stable position. Previously identified right pneumothorax is almost completely resolved with minimal right apical residual. Postsurgical changes again noted over both lungs. Persistent right upper lung atelectatic changes again noted. Bilateral interstitial prominence again noted without interim change. Bibasilar atelectasis again noted without interim change. Heart size stable.  IMPRESSION: 1. Right chest tube in stable position. Previously identified right pneumothorax has almost completely resolved with minimal right apical residual. 2. Postsurgical changes again noted over both lungs. Persistent right upper lung atelectatic changes again noted. Bilateral interstitial prominence and bibasilar atelectasis again noted without interim change.   Electronically Signed   By: Maisie Fus  Register   On: 08/17/2020 07:09  Treatments:  NAMEDAMEON, SOLTIS MEDICAL RECORD [id number] ACCOUNT [id number] DATE OF BIRTH:05-20-1956 FACILITY: [facility] LOCATION: [id number] PHYSICIAN:STEVEN Lars Pinks, MD  OPERATIVE REPORT  DATE OF PROCEDURE:  08/16/2020  PREOPERATIVE  DIAGNOSIS:  Recurrent right spontaneous pneumothorax.  POSTOPERATIVE DIAGNOSIS:  Recurrent right spontaneous pneumothorax.  PROCEDURE:   1. Right video-assisted thoracoscopy. 2.  Bleb resection, right upper lobe x2.   3.  Intercostal nerve blocks at levels 3 through 10.  SURGEON:  Charlett Lango, MD  ASSISTANT:  Gershon Crane, [facility]  ANESTHESIA:  General.  FINDINGS:  Large ruptured bleb along the inferior aspect of right upper lobe, apex of right lung, essentially a series of confluent blebs.   Discharge Exam: Blood pressure 130/79, pulse 91, temperature 98.4 F (36.9 C), temperature source Oral, resp. rate 20, height 6\' 1"  (1.854 m), weight 84.5 kg, SpO2 94 %.   General appearance: alert, cooperative and no distress Heart: regular rate and rhythm Lungs: clear to auscultation bilaterally Abdomen: soft, non-tender; bowel sounds normal; no masses,  no organomegaly Extremities: extremities normal, atraumatic, no cyanosis or edema Wound: clean and dry  Discharge disposition: 01-Home or Self Care   Allergies as of 08/21/2020      Reactions   Fentanyl Other (See Comments)   Stopped breathing   Doxycycline Nausea And Vomiting   Meperidine Hcl Nausea Only      Medication List    STOP taking these medications   amoxicillin-clavulanate 875-125 MG tablet Commonly known as: AUGMENTIN     TAKE these medications   acetaminophen 325 MG tablet Commonly known as: TYLENOL Take 650 mg by mouth every 6 (six) hours as needed for headache (pain).   albuterol 108 (90 Base) MCG/ACT inhaler Commonly known as: VENTOLIN HFA Inhale 2 puffs into the lungs every 6 (six) hours as needed for wheezing or shortness of breath.   Anoro Ellipta 62.5-25 MCG/INH Aepb Generic drug: umeclidinium-vilanterol INHALE 1 PUFF BY MOUTH EVERY DAY What changed:   how much to take  how to take this  when to take this  additional instructions   cetirizine 10 MG tablet Commonly known as:  ZYRTEC Take 10 mg by mouth at bedtime.   diphenhydramine-acetaminophen 25-500 MG Tabs tablet Commonly known as: TYLENOL PM Take 1 tablet by mouth at bedtime.   esomeprazole 20 MG capsule Commonly known as: NEXIUM Take 20 mg by mouth daily.   fluticasone 50 MCG/ACT nasal spray Commonly known as: FLONASE SPRAY 1 OR 2 TIMES INTO EACH NOSTRIL TWICE A DAY What changed: See the new instructions.   LANSOPRAZOLE PO Take 1 tablet by mouth at bedtime.   melatonin 5 MG Tabs Take 10 mg by mouth at bedtime.   multivitamin with minerals Tabs tablet Take 1 tablet by mouth daily.   naproxen sodium 220 MG tablet Commonly known as: ALEVE Take 220-440 mg by mouth 2 (two) times daily as needed (pain/headache).   oxyCODONE 5 MG immediate release tablet Commonly known as: Oxy IR/ROXICODONE Take 1 tablet (5 mg total) by mouth every 4 (four) hours as needed for moderate pain.   OXYGEN Inhale 3 L into the lungs at bedtime. During sleep   phenylephrine 10 MG Tabs tablet Commonly known as: SUDAFED PE Take 10 mg by mouth every 4 (four) hours as needed (congestion).   vitamin C 1000 MG tablet Take 1,000 mg by mouth daily.   VITAMIN D3 PO Take 1 tablet by mouth at bedtime.       Follow-up Information    12-09-1985, MD.  Call in 1 day(s).   Specialty: Internal Medicine Contact information: [address] [place] [address] [phone]        Loreli Slot, MD Follow up.   Specialty: Cardiothoracic Surgery Why: Your routine folow-up apopintment is on 11/2 at 10:30am. Please arrive at 10:00am for a chest xray which is located a [facility] which is on the first floor of our buidling.  Contact information: [address] [place] [address] [phone]               Signed:  Note by Jari Favre PA-c   Update by:  Lowella Dandy, PA-C  08/21/2020, 7:18 AM

## 2020-08-17 NOTE — Progress Notes (Addendum)
      [address]       [name] [address]             [phone]      1 Day Post-Op Procedure(s) (LRB): VIDEO ASSISTED THORACOSCOPY (Right) STAPLING OF BLEBS (Right) INTERCOSTAL NERVE BLOCK (Right) Subjective: Having some incisional pain this morning. Otherwise, doing okay  Objective: Vital signs in last 24 hours: Temp:  [97.6 F (36.4 C)-98.8 F (37.1 C)] 98.2 F (36.8 C) (10/08 0415) Pulse Rate:  [76-93] 81 (10/08 0415) Cardiac Rhythm: Normal sinus rhythm (10/08 0415) Resp:  [10-18] 18 (10/08 0415) BP: (117-150)/(72-87) 150/83 (10/08 0415) SpO2:  [90 %-100 %] 93 % (10/08 0415) Arterial Line BP: (77-155)/(59-101) 105/101 (10/08 0415)     Intake/Output from previous day: 10/07 0701 - 10/08 0700 In: 2105 [P.O.:1080; I.V.:925; IV Piggyback:100] Out: 2010 [Urine:1750; Blood:50; Chest Tube:210] Intake/Output this shift: No intake/output data recorded.  General appearance: alert, cooperative and no distress Heart: regular rate and rhythm, S1, S2 normal, no murmur, click, rub or gallop Lungs: clear to auscultation bilaterally Abdomen: soft, non-tender; bowel sounds normal; no masses,  no organomegaly Extremities: extremities normal, atraumatic, no cyanosis or edema Wound: clean and dry  Lab Results: Recent Labs    08/15/20 1147 08/17/20 0437  WBC 12.4* 18.6*  HGB 16.0 13.6  HCT 46.5 41.8  PLT 366 315   BMET:  Recent Labs    08/15/20 1147 08/17/20 0437  NA 137 134*  K 4.2 4.6  CL 103 102  CO2 20* 24  GLUCOSE 127* 118*  BUN 6* 7*  CREATININE 1.03 0.80  CALCIUM 9.1 8.8*    PT/INR:  Recent Labs    08/15/20 1815  LABPROT 12.4  INR 1.0   ABG    Component Value Date/Time   PHART 7.476 (H) 08/16/2020 0658   HCO3 24.9 08/16/2020 0658   TCO2 28 09/18/2016 0412   O2SAT 86.9 08/16/2020 0658   CBG (last 3)  No results for input(s): GLUCAP in the last 72 hours.  Assessment/Plan: S/P Procedure(s) (LRB): VIDEO ASSISTED THORACOSCOPY  (Right) STAPLING OF BLEBS (Right) INTERCOSTAL NERVE BLOCK (Right)  1. CXR this morning shows: Right chest tube in stable position. Previously identified right pneumothorax has almost completely resolved with minimal right apical residual 2. Chest tube to suction with 1+ air leak 3. BP a little elevated this morning, likely due to pain. He isn't on any BP meds at home. NSR in the 80s  Plan: Keep chest tube to suction today. Will order CXR for the morning. Discontinue arterial line and foley catheter. Fluids to [facility].    LOS: 2 days    Sharlene Dory 08/17/2020 Patient seen and examined, agree with above Has a small air leak- leave on suction another 24 hours- try on water seal tomorrow SCD + enoxaparin Ambulate  Viviann Spare C. Dorris Fetch, MD [facility] [phone]

## 2020-08-17 NOTE — Discharge Instructions (Signed)
Video-Assisted Thoracic Surgery, Care After This sheet gives you information about how to care for yourself after your procedure. Your health care provider may also give you more specific instructions. If you have problems or questions, contact your health care provider. What can I expect after the procedure? After the procedure, it is common to have:  Some pain and soreness in your chest.  Pain when breathing in (inhaling) and coughing.  Constipation.  Fatigue.  Difficulty sleeping. Follow these instructions at home: Preventing pneumonia  Take deep breaths or do breathing exercises as instructed by your health care provider. Doing this helps prevent lung infection (pneumonia).  Cough frequently. Coughing may cause discomfort, but it is important to clear mucus (phlegm) and expand your lungs. If it hurts to cough, hold a pillow against your chest or place the palms of both hands on top of the incision (use splinting) when you cough. This may help relieve discomfort.  If you were given an incentive spirometer, use it as directed. An incentive spirometer is a tool that measures how well you are filling your lungs with each breath.  Participate in pulmonary rehabilitation as directed by your health care provider. This is a program that combines education, exercise, and support from a team of specialists. The goal is to help you heal and get back to your normal activities as soon as possible. Medicines  Take over-the-counter or prescription medicines only as told by your health care provider.  If you have pain, take pain-relieving medicine before your pain becomes severe. This is important because if your pain is under control, you will be able to breathe and cough more comfortably.  If you were prescribed an antibiotic medicine, take it as told by your health care provider. Do not stop taking the antibiotic even if you start to feel better. Activity  Ask your health care provider what  activities are safe for you.  Avoid activities that use your chest muscles for at least 3-4 weeks.  Do not lift anything that is heavier than 10 lb (4.5 kg), or the limit that your health care provider tells you, until he or she says that it is safe. Incision care  Follow instructions from your health care provider about how to take care of your incision(s). Make sure you: ? Wash your hands with soap and water before you change your bandage (dressing). If soap and water are not available, use hand sanitizer. ? Change your dressing as told by your health care provider. ? Leave stitches (sutures), skin glue, or adhesive strips in place. These skin closures may need to stay in place for 2 weeks or longer. If adhesive strip edges start to loosen and curl up, you may trim the loose edges. Do not remove adhesive strips completely unless your health care provider tells you to do that.  Keep your dressing dry until it has been removed.  Check your incision area every day for signs of infection. Check for: ? Redness, swelling, or pain. ? Fluid or blood. ? Warmth. ? Pus or a bad smell. Bathing  Do not take baths, swim, or use a hot tub until your health care provider approves. You may take showers.  After your dressing has been removed, use soap and water to gently wash your incision area. Do not use anything else to clean your incision(s) unless your health care provider tells you to do this. Driving   Do not drive until your health care provider approves.  Do not drive or   use heavy machinery while taking prescription pain medicine. Eating and drinking  Eat a healthy, balanced diet as instructed by your health care provider. A healthy diet includes plenty of fresh fruits and vegetables, whole grains, and low-fat (lean) proteins.  Limit foods that are high in fat and processed sugars, such as fried and sweet foods.  Drink enough fluid to keep your urine clear or pale yellow. General  instructions   To prevent or treat constipation while you are taking prescription pain medicine, your health care provider may recommend that you: ? Take over-the-counter or prescription medicines. ? Eat foods that are high in fiber, such as beans, fresh fruits and vegetables, and whole grains.  Do not use any products that contain nicotine or tobacco, such as cigarettes and e-cigarettes. If you need help quitting, ask your health care provider.  Avoid secondhand smoke.  Wear compression stockings as told by your health care provider. These stockings help to prevent blood clots and reduce swelling in your legs.  If you have a chest tube, care for it as instructed by your health care provider. Do not travel by airplane during the 2 weeks after your chest tube is removed, or until your health care provider says that this is safe.  Keep all follow-up visits as told by your health care provider. This is important. Contact a health care provider if:  You have redness, swelling, or pain around an incision.  You have fluid or blood coming from an incision.  Your incision area feels warm to the touch.  You have pus or a bad smell coming from an incision.  You have a fever or chills.  You have nausea or vomiting.  You have pain that does not get better with medicine. Get help right away if:  You have chest pain.  Your heart is fluttering or beating rapidly.  You develop a rash.  You have shortness of breath or trouble breathing.  You are confused.  You have trouble speaking.  You feel weak, light-headed, or dizzy.  You faint. Summary  To help prevent lung infection (pneumonia), take deep breaths or do breathing exercises as instructed by your health care provider.  Cough frequently to clear mucus (phlegm) and expand your lungs. If it hurts to cough, hold a pillow against your chest or place the palms of both hands on top of the incision (use splinting) when you cough.  If  you have pain, take pain-relieving medicine before your pain becomes severe. This is important because if your pain is under control, you will be able to breathe and cough more comfortably.  Ask your health care provider what activities are safe for you. This information is not intended to replace advice given to you by your health care provider. Make sure you discuss any questions you have with your health care provider. Document Revised: 10/09/2017 Document Reviewed: 10/06/2016 Elsevier Patient Education  2020 [employer].  

## 2020-08-17 NOTE — Plan of Care (Signed)
Continue to monitor

## 2020-08-17 NOTE — Progress Notes (Signed)
Patient ambulated in the hallway with front wheel walker on RA about 240 ft. . Post ambulation patient's O2 was 91%. Patient assisted to bathroom. Patient tolerated well.

## 2020-08-18 ENCOUNTER — Inpatient Hospital Stay (HOSPITAL_COMMUNITY): Payer: Medicare Other

## 2020-08-18 LAB — CBC
HCT: 39.2 % (ref 39.0–52.0)
Hemoglobin: 12.9 g/dL — ABNORMAL LOW (ref 13.0–17.0)
MCH: 29.8 pg (ref 26.0–34.0)
MCHC: 32.9 g/dL (ref 30.0–36.0)
MCV: 90.5 fL (ref 80.0–100.0)
Platelets: 280 10*3/uL (ref 150–400)
RBC: 4.33 MIL/uL (ref 4.22–5.81)
RDW: 12.4 % (ref 11.5–15.5)
WBC: 18.5 10*3/uL — ABNORMAL HIGH (ref 4.0–10.5)
nRBC: 0 % (ref 0.0–0.2)

## 2020-08-18 LAB — COMPREHENSIVE METABOLIC PANEL
ALT: 26 U/L (ref 0–44)
AST: 23 U/L (ref 15–41)
Albumin: 2.9 g/dL — ABNORMAL LOW (ref 3.5–5.0)
Alkaline Phosphatase: 50 U/L (ref 38–126)
Anion gap: 11 (ref 5–15)
BUN: 8 mg/dL (ref 8–23)
CO2: 25 mmol/L (ref 22–32)
Calcium: 8.4 mg/dL — ABNORMAL LOW (ref 8.9–10.3)
Chloride: 99 mmol/L (ref 98–111)
Creatinine, Ser: 1 mg/dL (ref 0.61–1.24)
GFR, Estimated: >60 mL/min (ref >60)
Glucose, Bld: 116 mg/dL — ABNORMAL HIGH (ref 70–99)
Potassium: 3.8 mmol/L (ref 3.5–5.1)
Sodium: 135 mmol/L (ref 135–145)
Total Bilirubin: 0.7 mg/dL (ref 0.3–1.2)
Total Protein: 5.9 g/dL — ABNORMAL LOW (ref 6.5–8.1)

## 2020-08-18 NOTE — Progress Notes (Addendum)
        [address]       [place] [address]             [phone]      2 Days Post-Op Procedure(s) (LRB): VIDEO ASSISTED THORACOSCOPY (Right) STAPLING OF BLEBS (Right) INTERCOSTAL NERVE BLOCK (Right) Subjective: Still having some soreness but says this is better. No change in his breathing.   Objective: Vital signs in last 24 hours: Temp:  [98 F (36.7 C)-99.1 F (37.3 C)] 98.4 F (36.9 C) (10/09 0331) Pulse Rate:  [89-120] 94 (10/09 0331) Cardiac Rhythm: Normal sinus rhythm (10/09 0700) Resp:  [17-20] 18 (10/09 0331) BP: (123-146)/(65-83) 123/73 (10/09 0331) SpO2:  [92 %-99 %] 95 % (10/09 0843) FiO2 (%):  [28 %] 28 % (10/09 0843)    Intake/Output from previous day: 10/08 0701 - 10/09 0700 In: 240 [P.O.:240] Out: 465 [Urine:350; Chest Tube:115] Intake/Output this shift: No intake/output data recorded.  General appearance: alert, cooperative and mild distress Neurologic: intact Heart: regular rate and rhythm Lungs: Breath sounds clear, distant. No air leak today.  CT drainage ~164ml past 24 hours. CXR stable, right lung well expanded.  Wound: the right chest incisions are intact and dry.   Lab Results: Recent Labs    08/17/20 0437 08/18/20 0134  WBC 18.6* 18.5*  HGB 13.6 12.9*  HCT 41.8 39.2  PLT 315 280   BMET:  Recent Labs    08/17/20 0437 08/18/20 0134  NA 134* 135  K 4.6 3.8  CL 102 99  CO2 24 25  GLUCOSE 118* 116*  BUN 7* 8  CREATININE 0.80 1.00  CALCIUM 8.8* 8.4*    PT/INR:  Recent Labs    08/15/20 1815  LABPROT 12.4  INR 1.0   ABG    Component Value Date/Time   PHART 7.476 (H) 08/16/2020 0658   HCO3 24.9 08/16/2020 0658   TCO2 28 09/18/2016 0412   O2SAT 86.9 08/16/2020 0658   CBG (last 3)  No results for input(s): GLUCAP in the last 72 hours.  Assessment/Plan: S/P Procedure(s) (LRB): VIDEO ASSISTED THORACOSCOPY (Right) STAPLING OF BLEBS (Right) INTERCOSTAL NERVE BLOCK (Right)  -POD2 right VATS bleb  stapling for recurrent PTX. No air leak, CT to water seal today. CXR in AM. Encourage mobility.  -DVT PPX- continue enoxaparin.    LOS: 3 days    Leary Roca , PA-C [phone] 08/18/2020  After placing the CT to water seal, the right lung is reasonably well-inflated without clear PTX. Continue CT to [facility]. [name] Z. Vickey Sages, MD [phone]

## 2020-08-19 ENCOUNTER — Inpatient Hospital Stay (HOSPITAL_COMMUNITY): Payer: Medicare Other

## 2020-08-19 NOTE — Progress Notes (Signed)
        [address]       [place] [address]             [phone]      2 Days Post-Op Procedure(s) (LRB): VIDEO ASSISTED THORACOSCOPY (Right) STAPLING OF BLEBS (Right) INTERCOSTAL NERVE BLOCK (Right) Subjective: Pain controlled. No change in his breathing. No new concerns.   Objective: Vital signs in last 24 hours: Temp:  [98 F (36.7 C)-98.6 F (37 C)] 98.6 F (37 C) (10/10 0300) Pulse Rate:  [87-96] 90 (10/10 0300) Cardiac Rhythm: Normal sinus rhythm (10/10 0700) Resp:  [15-20] 15 (10/10 0300) BP: (142-144)/(80-85) 142/83 (10/10 0300) SpO2:  [90 %-95 %] 93 % (10/10 0816) FiO2 (%):  [28 %] 28 % (10/10 0816)    Intake/Output from previous day: 10/09 0701 - 10/10 0700 In: 120 [P.O.:120] Out: 700 [Urine:600; Chest Tube:100] Intake/Output this shift: No intake/output data recorded.  General appearance: alert, cooperative and mild distress Neurologic: intact Heart: SR / ST Lungs: Breath sounds clear, distant. No air leak today.  CT drainage minimal. CXR stable, right lung well expanded.  Wound: the right chest incisions are intact and dry.   Lab Results: Recent Labs    08/17/20 0437 08/18/20 0134  WBC 18.6* 18.5*  HGB 13.6 12.9*  HCT 41.8 39.2  PLT 315 280   BMET:  Recent Labs    08/17/20 0437 08/18/20 0134  NA 134* 135  K 4.6 3.8  CL 102 99  CO2 24 25  GLUCOSE 118* 116*  BUN 7* 8  CREATININE 0.80 1.00  CALCIUM 8.8* 8.4*    PT/INR:  No results for input(s): LABPROT, INR in the last 72 hours. ABG    Component Value Date/Time   PHART 7.476 (H) 08/16/2020 0658   HCO3 24.9 08/16/2020 0658   TCO2 28 09/18/2016 0412   O2SAT 86.9 08/16/2020 0658   CBG (last 3)  No results for input(s): GLUCAP in the last 72 hours.  Assessment/Plan: S/P Procedure(s) (LRB): VIDEO ASSISTED THORACOSCOPY (Right) STAPLING OF BLEBS (Right) INTERCOSTAL NERVE BLOCK (Right)  -POD3 right VATS bleb stapling for recurrent PTX. No air leak right lung  remains expanded on water seal, leave tube in place one more day and likely remove in am if CXR stable.      LOS: 4 days    Leary Roca , PA-C [phone] 08/19/2020

## 2020-08-20 ENCOUNTER — Inpatient Hospital Stay (HOSPITAL_COMMUNITY): Payer: Medicare Other

## 2020-08-20 NOTE — Progress Notes (Signed)
CT removed per order. VSS. Pt tolerated well. Radiology notified of chest XRAY. Will continue to monitor.  Versie Starks, RN

## 2020-08-20 NOTE — Care Management Important Message (Signed)
Important Message  Patient Details  Name: Jay [place] MRN: [id number] Date of Birth: [birth year]   Medicare Important Message Given:  Yes     Renie Ora 08/20/2020, 11:48 AM

## 2020-08-20 NOTE — Progress Notes (Addendum)
      [address]       [place] [address]             [phone]      4 Days Post-Op Procedure(s) (LRB): VIDEO ASSISTED THORACOSCOPY (Right) STAPLING OF BLEBS (Right) INTERCOSTAL NERVE BLOCK (Right) Subjective: Pain well controlled.   Objective: Vital signs in last 24 hours: Temp:  [98.4 F (36.9 C)-98.6 F (37 C)] 98.4 F (36.9 C) (10/11 0452) Pulse Rate:  [91-99] 91 (10/11 0452) Cardiac Rhythm: Sinus tachycardia (10/10 1956) Resp:  [13-20] 20 (10/11 0452) BP: (126-135)/(82-99) 131/87 (10/11 0452) SpO2:  [93 %-94 %] 94 % (10/11 0452) FiO2 (%):  [28 %] 28 % (10/10 0816)    Intake/Output from previous day: 10/10 0701 - 10/11 0700 In: 120 [P.O.:120] Out: 1020 [Urine:1000; Chest Tube:20] Intake/Output this shift: No intake/output data recorded.  General appearance: alert, cooperative and no distress Heart: regular rate and rhythm, S1, S2 normal, no murmur, click, rub or gallop Lungs: clear to auscultation bilaterally Abdomen: soft, non-tender; bowel sounds normal; no masses,  no organomegaly Extremities: extremities normal, atraumatic, no cyanosis or edema Wound: clean and dry  Lab Results: Recent Labs    08/18/20 0134  WBC 18.5*  HGB 12.9*  HCT 39.2  PLT 280   BMET:  Recent Labs    08/18/20 0134  NA 135  K 3.8  CL 99  CO2 25  GLUCOSE 116*  BUN 8  CREATININE 1.00  CALCIUM 8.4*    PT/INR: No results for input(s): LABPROT, INR in the last 72 hours. ABG    Component Value Date/Time   PHART 7.476 (H) 08/16/2020 0658   HCO3 24.9 08/16/2020 0658   TCO2 28 09/18/2016 0412   O2SAT 86.9 08/16/2020 0658   CBG (last 3)  No results for input(s): GLUCAP in the last 72 hours.  Assessment/Plan: S/P Procedure(s) (LRB): VIDEO ASSISTED THORACOSCOPY (Right) STAPLING OF BLEBS (Right) INTERCOSTAL NERVE BLOCK (Right)  1. CXR this morning appears stable. No air leak on exam. Fibrinous material in the tube with possible occlusion. Attempted  to strip the tube.  2. Pulm-Tolerating 2L [place] with good oxygen saturation.  3. Renal-creatinine 1.00, electrolytes okay 4. Continue lovenox for DVT prophylaxis  Plan: CXR stable and no air leak on my exam. Can likely remove chest tube this morning but will leave this up to Dr. Dorris Fetch. CXR in the AM and possibly home.   LOS: 5 days    Sharlene Dory 08/20/2020 Patient seen and examined, agree with above Dc chest tube Will then observe overnight, possibly home tomorrow  Viviann Spare C. Dorris Fetch, MD [facility] [phone]

## 2020-08-20 NOTE — Progress Notes (Signed)
Pt ambulated in hallway 241ft with 2L [place] and walker. Tolerated well. Returned to bed. Will continue to monitor.  Versie Starks, RN

## 2020-08-21 ENCOUNTER — Inpatient Hospital Stay (HOSPITAL_COMMUNITY): Payer: Medicare Other

## 2020-08-21 MED ORDER — OXYCODONE HCL 5 MG PO TABS
5.0000 mg | ORAL_TABLET | ORAL | 0 refills | Status: DC | PRN
Start: 2020-08-21 — End: 2021-09-02

## 2020-08-21 NOTE — Progress Notes (Signed)
Discharge instructions given to patient. IV removed, clean and intact. Medications and wound care reviewed, all questions answered.  Pt escorted home with wife.   Willi Borowiak R Marshaun Lortie, RN  

## 2020-08-21 NOTE — Progress Notes (Addendum)
      [address]       [place] [address]             [phone]       5 Days Post-Op Procedure(s) (LRB): VIDEO ASSISTED THORACOSCOPY (Right) STAPLING OF BLEBS (Right) INTERCOSTAL NERVE BLOCK (Right)   Subjective:  Doing okay.  Pain is controlled.  + ambulation  + BM  Objective: Vital signs in last 24 hours: Temp:  [97.8 F (36.6 C)-98.6 F (37 C)] 98.4 F (36.9 C) (10/12 0526) Pulse Rate:  [84-95] 91 (10/11 2336) Cardiac Rhythm: Normal sinus rhythm (10/11 2340) Resp:  [15-20] 20 (10/12 0526) BP: (130-143)/(77-85) 130/79 (10/11 2336) SpO2:  [94 %-96 %] 94 % (10/11 2336)  Intake/Output from previous day: 10/11 0701 - 10/12 0700 In: 360 [P.O.:360] Out: 950 [Urine:950]  General appearance: alert, cooperative and no distress Heart: regular rate and rhythm Lungs: clear to auscultation bilaterally Abdomen: soft, non-tender; bowel sounds normal; no masses,  no organomegaly Extremities: extremities normal, atraumatic, no cyanosis or edema Wound: clean and dry  Lab Results: No results for input(s): WBC, HGB, HCT, PLT in the last 72 hours. BMET: No results for input(s): NA, K, CL, CO2, GLUCOSE, BUN, CREATININE, CALCIUM in the last 72 hours.  PT/INR: No results for input(s): LABPROT, INR in the last 72 hours. ABG    Component Value Date/Time   PHART 7.476 (H) 08/16/2020 0658   HCO3 24.9 08/16/2020 0658   TCO2 28 09/18/2016 0412   O2SAT 86.9 08/16/2020 0658   CBG (last 3)  No results for input(s): GLUCAP in the last 72 hours.  Assessment/Plan: S/P Procedure(s) (LRB): VIDEO ASSISTED THORACOSCOPY (Right) STAPLING OF BLEBS (Right) INTERCOSTAL NERVE BLOCK (Right)  1. CV- hemodynamically stable 2. Pulm- severe COPD on oxygen at home, CT removed yesterday, CXR shows trace apical space 3. Dispo- patient stable, will d/c home today   LOS: 6 days    Lowella Dandy, PA-C 08/21/2020 Patient seen and examined, agree with above  Viviann Spare C.  Dorris Fetch, MD [facility] [phone]

## 2020-08-28 ENCOUNTER — Ambulatory Visit: Payer: Medicare Other | Admitting: Thoracic Surgery (Cardiothoracic Vascular Surgery)

## 2020-09-10 ENCOUNTER — Other Ambulatory Visit: Payer: Self-pay | Admitting: Thoracic Surgery (Cardiothoracic Vascular Surgery)

## 2020-09-10 DIAGNOSIS — J439 Emphysema, unspecified: Secondary | ICD-10-CM

## 2020-09-11 ENCOUNTER — Ambulatory Visit
Admission: RE | Admit: 2020-09-11 | Discharge: 2020-09-11 | Disposition: A | Discharge status: Home / Self Care | Payer: Medicare Other | Source: Ambulatory Visit | Attending: Thoracic Surgery (Cardiothoracic Vascular Surgery) | Admitting: Thoracic Surgery (Cardiothoracic Vascular Surgery)

## 2020-09-11 ENCOUNTER — Ambulatory Visit (INDEPENDENT_AMBULATORY_CARE_PROVIDER_SITE_OTHER): Payer: Self-pay | Admitting: Thoracic Surgery (Cardiothoracic Vascular Surgery)

## 2020-09-11 ENCOUNTER — Other Ambulatory Visit: Payer: Self-pay

## 2020-09-11 ENCOUNTER — Encounter: Payer: Self-pay | Admitting: Thoracic Surgery (Cardiothoracic Vascular Surgery)

## 2020-09-11 VITALS — BP 148/100 | HR 100 | Temp 97.8°F | Resp 20 | Ht 73.0 in | Wt 191.0 lb

## 2020-09-11 DIAGNOSIS — J439 Emphysema, unspecified: Secondary | ICD-10-CM

## 2020-09-11 DIAGNOSIS — Z09 Encounter for follow-up examination after completed treatment for conditions other than malignant neoplasm: Secondary | ICD-10-CM

## 2020-09-11 NOTE — Progress Notes (Signed)
[address]       [place] [address]             [phone]     HPI: Jay Mitchell returns for follow-up after his recent surgery.  Jay Mitchell is a 64 year old man with a past history of tobacco abuse, COPD, lung nodules, left pneumothorax, and reflux.  In 2017 he had a left pneumothorax which required a VATS for bleb resection.  He presented with a 5-day history of shortness of breath.  He was found to have a right pneumothorax.  I did a right VATS for bleb resection and pleural abrasion on 08/16/2020.  Postoperatively his air leak resolved rapidly and he was discharged home on postoperative day #5.  Since discharge she has been doing well.  He is no longer having to take oxycodone.  He is using Tylenol still for pain.  He does notice discomfort when he coughs or sneezes.  He has not had any significant respiratory issues.  Past Medical History:  Diagnosis Date  . COPD (chronic obstructive pulmonary disease) (HCC)   . GERD (gastroesophageal reflux disease)   . Spontaneous pneumothorax 09/15/2016   left    Current Outpatient Medications  Medication Sig Dispense Refill  . acetaminophen (TYLENOL) 325 MG tablet Take 650 mg by mouth every 6 (six) hours as needed for headache (pain).    Marland Kitchen albuterol (VENTOLIN HFA) 108 (90 Base) MCG/ACT inhaler Inhale 2 puffs into the lungs every 6 (six) hours as needed for wheezing or shortness of breath. 18 g 11  . Ascorbic Acid (VITAMIN C) 1000 MG tablet Take 1,000 mg by mouth daily.    . cetirizine (ZYRTEC) 10 MG tablet Take 10 mg by mouth at bedtime.     . Cholecalciferol (VITAMIN D3 PO) Take 1 tablet by mouth at bedtime.    . diphenhydramine-acetaminophen (TYLENOL PM) 25-500 MG TABS tablet Take 1 tablet by mouth at bedtime.     Marland Kitchen esomeprazole (NEXIUM) 20 MG capsule Take 20 mg by mouth daily.     . fluticasone (FLONASE) 50 MCG/ACT nasal spray SPRAY 1 OR 2 TIMES INTO EACH NOSTRIL TWICE A DAY (Patient taking differently: Place 1  spray into both nostrils daily as needed for allergies or rhinitis. ) 16 g 3  . LANSOPRAZOLE PO Take 1 tablet by mouth at bedtime.     . melatonin 5 MG TABS Take 10 mg by mouth at bedtime.     . Multiple Vitamin (MULTIVITAMIN WITH MINERALS) TABS tablet Take 1 tablet by mouth daily.    . naproxen sodium (ALEVE) 220 MG tablet Take 220-440 mg by mouth 2 (two) times daily as needed (pain/headache).    . oxyCODONE (OXY IR/ROXICODONE) 5 MG immediate release tablet Take 1 tablet (5 mg total) by mouth every 4 (four) hours as needed for moderate pain. 30 tablet 0  . OXYGEN Inhale 3 L into the lungs at bedtime. During sleep    . phenylephrine (SUDAFED PE) 10 MG TABS tablet Take 10 mg by mouth every 4 (four) hours as needed (congestion).     Marland Kitchen umeclidinium-vilanterol (ANORO ELLIPTA) 62.5-25 MCG/INH AEPB INHALE 1 PUFF BY MOUTH EVERY DAY (Patient taking differently: Inhale 1 puff into the lungs daily. ) 180 each 3   No current facility-administered medications for this visit.    Physical Exam BP (!) 148/100   Pulse 100   Temp 97.8 F (36.6 C) (Skin)   Resp 20   Ht 6\' 1"  (  1.854 m)   Wt 191 lb (86.6 kg)   SpO2 94% Comment: RA  BMI 25.38 kg/m  64 year old man in no acute distress Alert and oriented x3 with no focal deficits Cardiac regular rate and rhythm Incisions clean dry and intact Lungs diminished but clear bilaterally  Diagnostic Tests: CHEST - 2 VIEW  COMPARISON:  08/21/2020  FINDINGS: Persistent changes in the upper lungs bilaterally. Scarring/atelectasis in the bilateral lower lobes. No focal consolidation. No definite pleural effusion or pneumothorax.  The heart is normal in size.  Mild degenerative changes of the visualized thoracolumbar spine.  IMPRESSION: Postsurgical changes in the upper lungs bilaterally. No pneumothorax is seen.   Electronically Signed   By: Charline Bills M.D.   On: 09/11/2020 10:41 I personally reviewed the chest x-ray images and concur  with the findings noted above  Impression: Jay Mitchell is a 64 year old man with a past history of tobacco abuse (quit 2004), COPD, left pneumothorax, left VATS, lung nodules, and reflux.  He presented in early October with chest discomfort and shortness of breath.  He had a right pneumothorax.  He underwent right VATS for bleb resection on 08/16/2020.  He did well postoperatively and went home on day 5.  He is doing well.  He is no longer having to take narcotics for discomfort.  He is still using Tylenol.  That should continue to improve with time.  He has not had any respiratory issues since discharge.  He may begin driving.  Appropriate precautions were discussed.  There are no restrictions on his activities, but he should build into new activities gradually.  Lung nodules-followed by Dr. Isaiah Serge.  He has an appointment with Dr. Isaiah Serge in January.  Plan: I will be happy to see Mr. Behrend back anytime in the future if I can be of any further assistance with his care  Loreli Slot, MD [facility] [phone]

## 2021-05-08 ENCOUNTER — Other Ambulatory Visit: Payer: Self-pay

## 2021-05-08 ENCOUNTER — Ambulatory Visit (INDEPENDENT_AMBULATORY_CARE_PROVIDER_SITE_OTHER): Payer: Medicare Other | Admitting: Pulmonary Disease

## 2021-05-08 ENCOUNTER — Ambulatory Visit (INDEPENDENT_AMBULATORY_CARE_PROVIDER_SITE_OTHER): Payer: Medicare Other

## 2021-05-08 VITALS — BP 148/70 | HR 87 | Ht 73.0 in | Wt 206.6 lb

## 2021-05-08 DIAGNOSIS — J449 Chronic obstructive pulmonary disease, unspecified: Secondary | ICD-10-CM

## 2021-05-08 DIAGNOSIS — J939 Pneumothorax, unspecified: Secondary | ICD-10-CM | POA: Diagnosis not present

## 2021-05-08 MED ORDER — ANORO ELLIPTA 62.5-25 MCG/INH IN AEPB: INHALATION_SPRAY | RESPIRATORY_TRACT | 3 refills | Status: DC

## 2021-05-08 NOTE — Patient Instructions (Addendum)
Glad you are doing well with regard to your breathing Continue anoro.  We will send in refills if needed We will get chest x-ray today  Follow-up in 6 months

## 2021-05-08 NOTE — Progress Notes (Signed)
Jay Mitchell    [id number]    February 27, 1956  Primary Care Physician:Burns, Bobette Mo, MD  Referring Physician: Pincus Sanes, MD [address] [place],  [place] [address]  Chief complaint:  Follow-up for  Spontaneous pneumothorax COPD BOOP> resolved Reccurent pnuemonia  HPI: Patient is a 65 year old male with reported history of COPD, remote history of tobacco use, and GE reflux disease who was hospitalized in early  nov 2017 when he developed sudden onset of left-sided chest pain and shortness of breath. At the time he was traveling in the [place] part of [place] for business where he was attending a conference. He was evaluated in the emergency department there and found to have a large left pneumothorax with complete collapse of the left lung, due to ruptured bleb. A small bore chest tube was placed but the lung only partially reexpanded. Subsequently the initial chest tube was removed and the larger chest tube placed by a surgeon. The patient was noted to have persistent air leak, and at the patient's request he was transferred to [facility] on 09/16/16  for further management. Cardiothoracic surgery was consulted for evaluation of left sided pneumothorax and subsequently underwent VATS with multiple blebectomy, apical pleurectomy, and mechanical pleural abrasion on 09/17/16. He is a former smoker who quit in 2004. 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  He has a COPD flare and left-sided pneumonia in October 2018.  Initially treated with doxycycline which he did not tolerate and completed a course of azithromycin and Levaquin with improvement in symptoms.    Interim history: He was hospitalized again with recurrent pneumothorax on the right.  Seen by Dr. Dorris Fetch with a chest tube which failed and ultimately underwent VATS on 08/16/2020.  Is doing better now.  She feels back to  normal Continues on anoro inhaler  Outpatient Encounter Medications as of 05/08/2021  Medication Sig   acetaminophen (TYLENOL) 325 MG tablet Take 650 mg by mouth every 6 (six) hours as needed for headache (pain).   albuterol (VENTOLIN HFA) 108 (90 Base) MCG/ACT inhaler Inhale 2 puffs into the lungs every 6 (six) hours as needed for wheezing or shortness of breath.   Ascorbic Acid (VITAMIN C) 1000 MG tablet Take 1,000 mg by mouth daily.   cetirizine (ZYRTEC) 10 MG tablet Take 10 mg by mouth at bedtime.    Cholecalciferol (VITAMIN D3 PO) Take 1 tablet by mouth at bedtime.   diphenhydramine-acetaminophen (TYLENOL PM) 25-500 MG TABS tablet Take 1 tablet by mouth at bedtime.    esomeprazole (NEXIUM) 20 MG capsule Take 20 mg by mouth daily.    fluticasone (FLONASE) 50 MCG/ACT nasal spray SPRAY 1 OR 2 TIMES INTO EACH NOSTRIL TWICE A DAY (Patient taking differently: Place 1 spray into both nostrils daily as needed for allergies or rhinitis.)   LANSOPRAZOLE PO Take 1 tablet by mouth at bedtime.    melatonin 5 MG TABS Take 10 mg by mouth at bedtime.    Multiple Vitamin (MULTIVITAMIN WITH MINERALS) TABS tablet Take 1 tablet by mouth daily.   naproxen sodium (ALEVE) 220 MG tablet Take 220-440 mg by mouth 2 (two) times daily as needed (pain/headache).   OXYGEN Inhale 2 L into the lungs at bedtime. During sleep   phenylephrine (SUDAFED PE) 10 MG TABS tablet Take 10 mg by mouth every 4 (four) hours as  needed (congestion).    umeclidinium-vilanterol (ANORO ELLIPTA) 62.5-25 MCG/INH AEPB INHALE 1 PUFF BY MOUTH EVERY DAY (Patient taking differently: Inhale 1 puff into the lungs daily.)   oxyCODONE (OXY IR/ROXICODONE) 5 MG immediate release tablet Take 1 tablet (5 mg total) by mouth every 4 (four) hours as needed for moderate pain.   No facility-administered encounter medications on file as of 05/08/2021.  Marland Kitchen  Physical Exam: Blood pressure (!) 148/70, pulse 87, height 6\' 1"  (1.854 m), weight 206 lb 9.6 oz (93.7  kg), SpO2 99 %. Gen:      No acute distress HEENT:  EOMI, sclera anicteric Neck:     No masses; no thyromegaly Lungs:    Clear to auscultation bilaterally; normal respiratory effort CV:         Regular rate and rhythm; no murmurs Abd:      + bowel sounds; soft, non-tender; no palpable masses, no distension Ext:    No edema; adequate peripheral perfusion Skin:      Warm and dry; no rash Neuro: alert and oriented x 3 Psych: normal mood and affect   Data Reviewed: Surgical pathology 09/17/16- lung parenchyma with bullae and lung fibrosis,  bronchiolitis obliterans organizing pneumonia.   CT chest without contrast 09/16/16- Severe bullous emphysema. Pneumothrax, B/L LL consolidations R > L.  High-resolution CT of the chest 11/25/16-  no evidence of interstitial lung disease, postsurgical scarring. Several subcentimeter scattered pulmonary lymph nodes. Moderate-severe emphysema. Chest x-ray 08/03/17-left lung infiltrate with multilobar pneumonia, postsurgical changes in the left lung Chest x-ray 08/27/17-improving left lung infiltrate Chest x-ray 09/16/17- stable left lung apical opacity Chest x-ray 10/16/17-clearing of left upper lobe infiltrate.  Stable scarring, postoperative changes on the left. CT chest 04/16/2018- advanced emphysematous changes.  Stable lung nodules. CT chest 05/23/2019- stable findings of emphysema and lung nodules Chest x-ray 08/10/2020-moderate pneumothorax on the right CT chest 08/15/2020-large right pneumothorax with severe bullous emphysema Chest x-ray 05/08/2021-stable scarring, no acute disease I reviewed the images personally.  PFTs 12/10/16 FVC 2.90 (76%), FEV1 2.55 [66%], F/F 65, TLC 81%, DLCO 44% Moderate obstructive airway disease, severe diffusion defect.  Immune serologies 10/06/16 ANA positive, ENA RNP positive  A1 AT 12/12/16- 147, PIMM phenotype. CBC 09/16/17-absolute eosinophil count 88  Assessment:  Recurrent pneumothorax He has history of Spontaneous  tension pneumothorax on the left s/p VATS, blebectomy, apical pleurectomy, mechanical pleural abrasion- Nov 2017 Developed pneumothorax on right and underwent right VATS  Follow-up chest x-ray today reviewed with chronic scarring and no acute process  Severe bullous emphysema COPD Ex smoker Stable on anoro.  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 A follow-up CT scan has shown resolution of any interstitial opacities. An autoimmune workup shows elevation in ANA and ENA which is likely nonspecific.  Lung nodules Has remained stable since 2017.  No further follow-up required  Health maintenance 09/16/2017-Pneumovax Up-to-date with flu and COVID-19 vaccination  Plan/Recommendations: -Continue anoro  13/05/2017 MD [facility] 05/08/2021, 11:35 AM  CC: 05/10/2021, MD

## 2021-05-09 ENCOUNTER — Encounter: Payer: Self-pay | Admitting: Pulmonary Disease

## 2021-09-02 ENCOUNTER — Other Ambulatory Visit: Payer: Self-pay

## 2021-09-02 ENCOUNTER — Encounter: Payer: Self-pay | Admitting: Acute Care

## 2021-09-02 ENCOUNTER — Ambulatory Visit (INDEPENDENT_AMBULATORY_CARE_PROVIDER_SITE_OTHER): Payer: Medicare Other | Admitting: Acute Care

## 2021-09-02 ENCOUNTER — Telehealth: Payer: Self-pay | Admitting: Pulmonary Disease

## 2021-09-02 ENCOUNTER — Other Ambulatory Visit: Payer: Self-pay | Admitting: Acute Care

## 2021-09-02 VITALS — BP 128/72 | HR 88 | Temp 97.8°F | Ht 73.0 in | Wt 207.8 lb

## 2021-09-02 DIAGNOSIS — Z23 Encounter for immunization: Secondary | ICD-10-CM | POA: Diagnosis not present

## 2021-09-02 DIAGNOSIS — J441 Chronic obstructive pulmonary disease with (acute) exacerbation: Secondary | ICD-10-CM

## 2021-09-02 DIAGNOSIS — R051 Acute cough: Secondary | ICD-10-CM

## 2021-09-02 MED ORDER — ZITHROMAX Z-PAK 250 MG PO TABS: ORAL_TABLET | ORAL | 0 refills | Status: DC

## 2021-09-02 MED ORDER — PREDNISONE 10 MG PO TABS: ORAL_TABLET | ORAL | 0 refills | Status: DC

## 2021-09-02 MED ORDER — HYDROCODONE BIT-HOMATROP MBR 5-1.5 MG/5ML PO SOLN: 5.0000 mL | Freq: Every evening | ORAL | 0 refills | Status: DC | PRN

## 2021-09-02 NOTE — Patient Instructions (Addendum)
It is good to see you today. We will start Mucinex 1200 mg daily wirh a full glass of water to thin out your secretions. We will send in a prescription for Prednisone taper; 10 mg tablets: 4 tabs x 2 days, 3 tabs x 2 days, 2 tabs x 2 days 1 tab x 2 days then stop.  We will send in a prescription for a z pack. Take as directed, 2 tablets today and then 1 tablet each day until gone.  Continue Anoro 1 puff once daily. Delsym for cough, or theraflu as you have been doing. He states he rarely uses his rescue inhaler.  Flu shot today.  Wear oxygen at bedtime as you have been doing. We will send in a prescription for Hydromet cough syrup. Take 5 cc's at bedtime. Do not drive if sleepy.  Follow up with Dr. Isaiah Mitchell in December  Pneumococcal vaccine when you see Dr. Isaiah Mitchell in December.  Follow up in 2 weeks with Jay Sago NP to make sure you are better.  Note your daily symptoms > remember "red flags" for COPD:  Increase in cough, increase in sputum production, increase in shortness of breath or activity intolerance. If you notice these symptoms, please call to be seen.    Please contact office for sooner follow up if symptoms do not improve or worsen or seek emergency care

## 2021-09-02 NOTE — Telephone Encounter (Signed)
Primary Pulmonologist: Chilton Greathouse Last office visit and with whom: 05/08/2021 with PM What do we see them for (pulmonary problems): COPD Last OV assessment/plan: Glad you are doing well with regard to your breathing Continue anoro.  We will send in refills if needed We will get chest x-ray today  Follow-up in 6 months  Was appointment offered to patient (explain)?  Yes   Reason for call: Patient reports that he was around a lot of dust over the weekend and was trying to wear a mask to help decrease the amount going into his lungs and he is concerned about the cough going into a worse infection due to him having a collapsed lung and surgery on his lungs. He was requesting to be seen. He is coming in for a visit. FYI to provider.   (examples of things to ask: : When did symptoms start? Fever? Cough? Productive? Color to sputum? More sputum than usual? Wheezing? Have you needed increased oxygen? Are you taking your respiratory medications? What over the counter measures have you tried?)  Allergies  Allergen Reactions   Fentanyl Other (See Comments)    Stopped breathing   Doxycycline Nausea And Vomiting   Meperidine Hcl Nausea Only    Immunization History  Administered Date(s) Administered   Fluad Quad(high Dose 65+) 08/10/2019   Influenza Inj Mdck Quad Pf 08/05/2018, 08/18/2019   Influenza Split 09/05/2016   Influenza,inj,Quad PF,6+ Mos 09/16/2017, 07/09/2020   Influenza-Unspecified 08/05/2018, 08/18/2019   PFIZER(Purple Top)SARS-COV-2 Vaccination 02/08/2020, 03/04/2020   Pneumococcal Polysaccharide-23 09/16/2017   Tdap 02/15/2018

## 2021-09-02 NOTE — Telephone Encounter (Signed)
Patient denied any fever or body aches.

## 2021-09-02 NOTE — Progress Notes (Signed)
History of Present Illness Jay Mitchell is a 65 y.o. male former smoker ( quit 2004) with history of COPD, GERD, spontaneous pneumothorax in 2017, that required a VATS surgery. He had another spontaneous pneumothorax requiring VATS 09/2020. He is followed by Dr. Isaiah Serge. Surgeon Dr. Dorris Fetch.   09/02/2021 Pt. Presents for Acute visit. 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 He states he has thick green secretions and chest congestion with cough. He would like an antibiotic and something to stop his cough as he has had spontaneous pneumothorax.   Test Results: Surgical pathology 09/17/16- lung parenchyma with bullae and lung fibrosis,  bronchiolitis obliterans organizing pneumonia.    CT chest without contrast 09/16/16- Severe bullous emphysema. Pneumothrax, B/L LL consolidations R > L.  High-resolution CT of the chest 11/25/16-  no evidence of interstitial lung disease, postsurgical scarring. Several subcentimeter scattered pulmonary lymph nodes. Moderate-severe emphysema. Chest x-ray 08/03/17-left lung infiltrate with multilobar pneumonia, postsurgical changes in the left lung Chest x-ray 08/27/17-improving left lung infiltrate Chest x-ray 09/16/17- stable left lung apical opacity Chest x-ray 10/16/17-clearing of left upper lobe infiltrate.  Stable scarring, postoperative changes on the left. CT chest 04/16/2018- advanced emphysematous changes.  Stable lung nodules. CT chest 05/23/2019- stable findings of emphysema and lung nodules Chest x-ray 08/10/2020-moderate pneumothorax on the right CT chest 08/15/2020-large right pneumothorax with severe bullous emphysema Chest x-ray 05/08/2021-stable scarring, no acute disease I reviewed the images personally.   PFTs 12/10/16 FVC 2.90 (76%), FEV1 2.55 [66%], F/F 65, TLC 81%, DLCO 44% Moderate obstructive airway disease, severe diffusion defect.   Immune serologies 10/06/16 ANA positive, ENA RNP  positive   A1 AT 12/12/16- 147, PIMM phenotype. CBC 09/16/17-absolute eosinophil count 88    CBC Latest Ref Rng & Units 08/18/2020 08/17/2020 08/15/2020  WBC 4.0 - 10.5 K/uL 18.5(H) 18.6(H) 12.4(H)  Hemoglobin 13.0 - 17.0 g/dL 12.9(L) 13.6 16.0  Hematocrit 39.0 - 52.0 % 39.2 41.8 46.5  Platelets 150 - 400 K/uL 280 315 366    BMP Latest Ref Rng & Units 08/18/2020 08/17/2020 08/15/2020  Glucose 70 - 99 mg/dL 947(M) 546(T) 035(W)  BUN 8 - 23 mg/dL 8 7(L) 6(L)  Creatinine 0.61 - 1.24 mg/dL 6.56 8.12 7.51  Sodium 135 - 145 mmol/L 135 134(L) 137  Potassium 3.5 - 5.1 mmol/L 3.8 4.6 4.2  Chloride 98 - 111 mmol/L 99 102 103  CO2 22 - 32 mmol/L 25 24 20(L)  Calcium 8.9 - 10.3 mg/dL 7.0(Y) 1.7(C) 9.1    BNP No results found for: BNP  ProBNP No results found for: PROBNP  PFT    Component Value Date/Time   FEV1PRE 2.53 12/10/2016 1547   FEV1POST 2.55 12/10/2016 1547   FVCPRE 3.89 12/10/2016 1547   FVCPOST 3.90 12/10/2016 1547   TLC 6.06 12/10/2016 1547   DLCOUNC 15.55 12/10/2016 1547   PREFEV1FVCRT 65 12/10/2016 1547   PSTFEV1FVCRT 65 12/10/2016 1547    No results found.   Past medical hx Past Medical History:  Diagnosis Date   COPD (chronic obstructive pulmonary disease) (HCC)    GERD (gastroesophageal reflux disease)    Spontaneous pneumothorax 09/15/2016   left     Social History   Tobacco Use   Smoking status: Former    Packs/day: 1.50    Years: 25.00    Pack years: 37.50    Types: Cigarettes    Quit date: 2004    Years since quitting: 18.8  Smokeless tobacco: Never  Substance Use Topics   Alcohol use: No   Drug use: No    Mr.[name] reports that he quit smoking about 18 years ago. His smoking use included cigarettes. He has a 37.50 pack-year smoking history. He has never used smokeless tobacco. He reports that he does not drink alcohol and does not use drugs.  Tobacco Cessation: Former smoker   Past surgical hx, Family hx, Social hx all reviewed.  Current  Outpatient Medications on File Prior to Visit  Medication Sig   acetaminophen (TYLENOL) 325 MG tablet Take 650 mg by mouth every 6 (six) hours as needed for headache (pain).   albuterol (VENTOLIN HFA) 108 (90 Base) MCG/ACT inhaler Inhale 2 puffs into the lungs every 6 (six) hours as needed for wheezing or shortness of breath.   Ascorbic Acid (VITAMIN C) 1000 MG tablet Take 1,000 mg by mouth daily.   cetirizine (ZYRTEC) 10 MG tablet Take 10 mg by mouth at bedtime.    Cholecalciferol (VITAMIN D3 PO) Take 1 tablet by mouth at bedtime.   diphenhydramine-acetaminophen (TYLENOL PM) 25-500 MG TABS tablet Take 1 tablet by mouth at bedtime.    esomeprazole (NEXIUM) 20 MG capsule Take 20 mg by mouth daily.    fluticasone (FLONASE) 50 MCG/ACT nasal spray SPRAY 1 OR 2 TIMES INTO EACH NOSTRIL TWICE A DAY (Patient taking differently: Place 1 spray into both nostrils daily as needed for allergies or rhinitis.)   LANSOPRAZOLE PO Take 1 tablet by mouth at bedtime.    melatonin 5 MG TABS Take 10 mg by mouth at bedtime.    Multiple Vitamin (MULTIVITAMIN WITH MINERALS) TABS tablet Take 1 tablet by mouth daily.   naproxen sodium (ALEVE) 220 MG tablet Take 220-440 mg by mouth 2 (two) times daily as needed (pain/headache).   OXYGEN Inhale 2 L into the lungs at bedtime. During sleep   phenylephrine (SUDAFED PE) 10 MG TABS tablet Take 10 mg by mouth every 4 (four) hours as needed (congestion).    umeclidinium-vilanterol (ANORO ELLIPTA) 62.5-25 MCG/INH AEPB INHALE 1 PUFF BY MOUTH EVERY DAY   oxyCODONE (OXY IR/ROXICODONE) 5 MG immediate release tablet Take 1 tablet (5 mg total) by mouth every 4 (four) hours as needed for moderate pain.   No current facility-administered medications on file prior to visit.     Allergies  Allergen Reactions   Fentanyl Other (See Comments)    Stopped breathing   Doxycycline Nausea And Vomiting   Meperidine Hcl Nausea Only    Review Of Systems:  Constitutional:   No  weight loss,  night sweats,  Fevers, chills, fatigue, or  lassitude.  HEENT:   No headaches,  Difficulty swallowing,  Tooth/dental problems, or  Sore throat,                No sneezing, itching, ear ache, nasal congestion, post nasal drip,   CV:  No chest pain,  Orthopnea, PND, swelling in lower extremities, anasarca, dizziness, palpitations, syncope.   GI  No heartburn, indigestion, abdominal pain, nausea, vomiting, diarrhea, change in bowel habits, loss of appetite, bloody stools.   Resp: No shortness of breath with exertion or at rest.  + excess mucus, + productive cough,  No non-productive cough,  No coughing up of blood.  + change in color of mucus.  No wheezing.  No chest wall deformity  Skin: no rash or lesions.  GU: no dysuria, change in color of urine, no urgency or frequency.  No flank pain, no  hematuria   MS:  No joint pain or swelling.  No decreased range of motion.  No back pain.  Psych:  No change in mood or affect. No depression or anxiety.  No memory loss.   Vital Signs BP 128/72 (BP Location: Left Arm, Cuff Size: Normal)   Pulse 88   Temp 97.8 F (36.6 C) (Oral)   Ht 6\' 1"  (1.854 m)   Wt 207 lb 12.8 oz (94.3 kg)   SpO2 98%   BMI 27.42 kg/m    Physical Exam:  General- No distress,  A&Ox3, pleasant in NAD ENT: No sinus tenderness, TM clear, pale nasal mucosa, no oral exudate,no post nasal drip, no LAN Cardiac: S1, S2, regular rate and rhythm, no murmur Chest: No wheeze/ rales/ dullness; no accessory muscle use, no nasal flaring, no sternal retractions Abd.: Soft Non-tender, ND, BS +, Body mass index is 27.42 kg/m.  Ext: No clubbing cyanosis, edema Neuro:  normal strength Skin: No rashes, warm and dry, no lesions  Psych: normal mood and behavior   Assessment/Plan COPD Flare after exposure to dust at work Plan We will start Mucinex 1200 mg daily wirh a full glass of water to thin out your secretions. We will send in a prescription for Prednisone taper; 10 mg tablets:  4 tabs x 2 days, 3 tabs x 2 days, 2 tabs x 2 days 1 tab x 2 days then stop.  We will send in a prescription for a z pack. Take as directed, 2 tablets today and then 1 tablet each day until gone.  Continue Anoro 1 puff once daily. Delsym for cough, or theraflu as you have been doing. He states he rarely uses his rescue inhaler.  Flu shot today.  Wear oxygen at bedtime as you have been doing. We will send in a prescription for Hydromet cough syrup. Take 5 cc's at bedtime. Do not drive if sleepy.  Follow up with Dr. in December  Pneumococcal vaccine when you see Dr. January in December.  Follow up in 2 weeks with January NP to make sure you are better.  Note your daily symptoms > remember "red flags" for COPD:  Increase in cough, increase in sputum production, increase in shortness of breath or activity intolerance. If you notice these symptoms, please call to be seen.    Please contact office for sooner follow up if symptoms do not improve or worsen or seek emergency care    I spent 40 minutes dedicated to the care of this patient on the date of this encounter to include pre-visit review of records, face-to-face time with the patient discussing conditions above, post visit ordering of testing, clinical documentation with the electronic health record, making appropriate referrals as documented, and communicating necessary information to the patient's healthcare team.   Maralyn Sago, NP 09/02/2021  10:32 AM

## 2021-09-16 ENCOUNTER — Ambulatory Visit: Payer: Medicare Other | Admitting: Acute Care

## 2021-10-01 NOTE — Telephone Encounter (Signed)
Pt had an OV with Maralyn Sago 10/24 after call. Closing encounter.

## 2021-10-21 ENCOUNTER — Encounter: Payer: Self-pay | Admitting: Pulmonary Disease

## 2021-10-21 ENCOUNTER — Other Ambulatory Visit: Payer: Self-pay

## 2021-10-21 ENCOUNTER — Ambulatory Visit (INDEPENDENT_AMBULATORY_CARE_PROVIDER_SITE_OTHER): Payer: Medicare Other | Admitting: Pulmonary Disease

## 2021-10-21 VITALS — BP 142/76 | HR 96 | Temp 98.1°F | Ht 73.0 in | Wt 207.4 lb

## 2021-10-21 DIAGNOSIS — Z23 Encounter for immunization: Secondary | ICD-10-CM

## 2021-10-21 DIAGNOSIS — J4489 Other specified chronic obstructive pulmonary disease: Secondary | ICD-10-CM

## 2021-10-21 DIAGNOSIS — J449 Chronic obstructive pulmonary disease, unspecified: Secondary | ICD-10-CM | POA: Diagnosis not present

## 2021-10-21 NOTE — Patient Instructions (Signed)
I am glad you are doing better with your breathing Continue anoro We will give you PPSV23 vaccination today Follow-up in 6 months

## 2021-10-21 NOTE — Addendum Note (Signed)
Addended by: Jacquiline Doe on: 10/21/2021 10:00 AM   Modules accepted: Orders

## 2021-10-21 NOTE — Progress Notes (Signed)
Jay Mitchell    [id number]    31-Oct-1956  Primary Care Physician:Burns, Bobette Mo, MD  Referring Physician: Pincus Sanes, MD [address] [name],  [place] [address]  Chief complaint:  Follow-up for  Spontaneous pneumothorax COPD BOOP> resolved Reccurent pnuemonia  HPI: Patient is a 65 year old male with reported history of COPD, remote history of tobacco use, and GE reflux disease who was hospitalized in early  nov 2017 when he developed sudden onset of left-sided chest pain and shortness of breath. At the time he was traveling in the [place] part of [place] for business where he was attending a conference. He was evaluated in the emergency department there and found to have a large left pneumothorax with complete collapse of the left lung, due to ruptured bleb. A small bore chest tube was placed but the lung only partially reexpanded. Subsequently the initial chest tube was removed and the larger chest tube placed by a surgeon. The patient was noted to have persistent air leak, and at the patient's request he was transferred to [facility] on 09/16/16  for further management. Cardiothoracic surgery was consulted for evaluation of left sided pneumothorax and subsequently underwent VATS with multiple blebectomy, apical pleurectomy, and mechanical pleural abrasion on 09/17/16. He is a former smoker who quit in 2004. 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  He has a COPD flare and left-sided pneumonia in October 2018.  Initially treated with doxycycline which he did not tolerate and completed a course of azithromycin and Levaquin with improvement in symptoms.   He was hospitalized again with recurrent pneumothorax on the right.  Seen by Dr. 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  He was treated with Z-Pak, steroids and cough syrup. Feels back to normal.  Continues on anoro inhaler.  Outpatient Encounter Medications as of 10/21/2021  Medication Sig   acetaminophen (TYLENOL) 325 MG tablet Take 650 mg by mouth every 6 (six) hours as needed for headache (pain).   albuterol (VENTOLIN HFA) 108 (90 Base) MCG/ACT inhaler Inhale 2 puffs into the lungs every 6 (six) hours as needed for wheezing or shortness of breath.   Ascorbic Acid (VITAMIN C) 1000 MG tablet Take 1,000 mg by mouth daily.   cetirizine (ZYRTEC) 10 MG tablet Take 10 mg by mouth at bedtime.    Cholecalciferol (VITAMIN D3 PO) Take 1 tablet by mouth at bedtime.   diphenhydramine-acetaminophen (TYLENOL PM) 25-500 MG TABS tablet Take 1 tablet by mouth at bedtime.    esomeprazole (NEXIUM) 20 MG capsule Take 20 mg by mouth daily.    fluticasone (FLONASE) 50 MCG/ACT nasal spray SPRAY 1 OR 2 TIMES INTO EACH NOSTRIL TWICE A DAY (Patient taking differently: Place 1 spray into both nostrils daily as needed for allergies or rhinitis.)   LANSOPRAZOLE PO Take 1 tablet by mouth at bedtime.    melatonin 5 MG TABS Take 10 mg by mouth at bedtime.    Multiple Vitamin (MULTIVITAMIN WITH MINERALS) TABS tablet Take 1 tablet by mouth daily.   naproxen sodium (ALEVE) 220 MG tablet Take 220-440 mg by mouth 2 (two) times daily as needed (pain/headache).   OXYGEN Inhale 2 L into the lungs at  bedtime. During sleep   phenylephrine (SUDAFED PE) 10 MG TABS tablet Take 10 mg by mouth every 4 (four) hours as needed (congestion).    umeclidinium-vilanterol (ANORO ELLIPTA) 62.5-25 MCG/INH AEPB INHALE 1 PUFF BY MOUTH EVERY DAY   [DISCONTINUED] HYDROcodone bit-homatropine (HYDROMET) 5-1.5 MG/5ML syrup Take 5 mLs by mouth at bedtime as needed for cough.   [DISCONTINUED] predniSONE (DELTASONE) 10 MG tablet Take 4 tabs x 2 days, then 3 x 2d, then 2 x 2d, then 1 x 2d and STOP   [DISCONTINUED] ZITHROMAX  Z-PAK 250 MG tablet Take two tablets today Then take one tablet daily until gone.   No facility-administered encounter medications on file as of 10/21/2021.    Physical Exam: Blood pressure (!) 142/76, pulse 96, temperature 98.1 F (36.7 C), temperature source Oral, height 6\' 1"  (1.854 m), weight 207 lb 6.4 oz (94.1 kg), SpO2 97 %. Gen:      No acute distress HEENT:  EOMI, sclera anicteric Neck:     No masses; no thyromegaly Lungs:    Diminished breath sounds CV:         Regular rate and rhythm; no murmurs Abd:      + bowel sounds; soft, non-tender; no palpable masses, no distension Ext:    No edema; adequate peripheral perfusion Skin:      Warm and dry; no rash Neuro: alert and oriented x 3 Psych: normal mood and affect  Data Reviewed: Imaging: CT chest without contrast 09/16/16- Severe bullous emphysema. Pneumothrax, B/L LL consolidations R > L.  High-resolution CT of the chest 11/25/16-  no evidence of interstitial lung disease, postsurgical scarring. Several subcentimeter scattered pulmonary lymph nodes. Moderate-severe emphysema. Chest x-ray 08/03/17-left lung infiltrate with multilobar pneumonia, postsurgical changes in the left lung Chest x-ray 08/27/17-improving left lung infiltrate Chest x-ray 09/16/17- stable left lung apical opacity Chest x-ray 10/16/17-clearing of left upper lobe infiltrate.  Stable scarring, postoperative changes on the left. CT chest 04/16/2018- advanced emphysematous changes.  Stable lung nodules. CT chest 05/23/2019- stable findings of emphysema and lung nodules Chest x-ray 08/10/2020-moderate pneumothorax on the right CT chest 08/15/2020-large right pneumothorax with severe bullous emphysema Chest x-ray 05/08/2021-stable scarring, no acute disease I reviewed the images personally.  PFTs 12/10/16 FVC 2.90 (76%), FEV1 2.55 [66%], F/F 65, TLC 81%, DLCO 44% Moderate obstructive airway disease, severe diffusion defect.  Labs: Immune serologies 10/06/16 ANA  positive, ENA RNP positive  A1 AT 12/12/16- 147, PIMM phenotype. CBC 09/16/17-absolute eosinophil count 88  Surgical pathology 09/17/16- lung parenchyma with bullae and lung fibrosis,  bronchiolitis obliterans organizing pneumonia.    Assessment:  Recurrent pneumothorax He has history of Spontaneous tension pneumothorax on the left s/p VATS, blebectomy, apical pleurectomy, mechanical pleural abrasion- Nov 2017 Developed pneumothorax on right and underwent right VATS in October 2021  Severe bullous emphysema COPD Ex smoker Stable on anoro.  Avoiding inhaled steroids as he developed pneumonia in 2018 Improved after recent mild exacerbation brought on by exposure.  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 A follow-up CT scan has shown resolution of any interstitial opacities. An autoimmune workup shows elevation in ANA and ENA which is likely nonspecific.  Lung nodules Has remained stable since 2017.  No further follow-up required  Health maintenance 09/16/2017-Pneumovax.  We will give repeat Pneumovax today as he is above 65 Up-to-date with flu and COVID-19 vaccination  Plan/Recommendations: - Continue anoro -  Pneumovax booster today  Chilton Greathouse MD [facility] 10/21/2021, 9:24 AM  CC: Pincus Sanes, MD

## 2022-04-29 ENCOUNTER — Ambulatory Visit (INDEPENDENT_AMBULATORY_CARE_PROVIDER_SITE_OTHER): Payer: Medicare Other | Admitting: Pulmonary Disease

## 2022-04-29 ENCOUNTER — Encounter: Payer: Self-pay | Admitting: Pulmonary Disease

## 2022-04-29 VITALS — BP 140/80 | HR 80 | Temp 97.8°F | Ht 73.0 in | Wt 208.6 lb

## 2022-04-29 DIAGNOSIS — J449 Chronic obstructive pulmonary disease, unspecified: Secondary | ICD-10-CM | POA: Diagnosis not present

## 2022-04-29 NOTE — Patient Instructions (Addendum)
I am glad you are doing well with the breathing Continue the anoro inhaler Follow-up in 6 months

## 2022-04-29 NOTE — Progress Notes (Signed)
Jay Mitchell    [id number]    Aug 20, 1956  Primary Care Physician:Burns, Bobette Mo, MD  Referring Physician: Pincus Sanes, MD [address] [place],  [place] [address]  Chief complaint:  Follow-up for  Spontaneous pneumothorax COPD BOOP> resolved Reccurent pnuemonia  HPI: Patient is a 66 year old male with reported history of COPD, remote history of tobacco use, and GE reflux disease who was hospitalized in early  nov 2017 when he developed sudden onset of left-sided chest pain and shortness of breath. At the time he was traveling in the [place] part of [place] for business where he was attending a conference. He was evaluated in the emergency department there and found to have a large left pneumothorax with complete collapse of the left lung, due to ruptured bleb. A small bore chest tube was placed but the lung only partially reexpanded. Subsequently the initial chest tube was removed and the larger chest tube placed by a surgeon. The patient was noted to have persistent air leak, and at the patient's request he was transferred to [facility] on 09/16/16  for further management. Cardiothoracic surgery was consulted for evaluation of left sided pneumothorax and subsequently underwent VATS with multiple blebectomy, apical pleurectomy, and mechanical pleural abrasion on 09/17/16. He is a former smoker who quit in 2004. 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  He has a COPD flare and left-sided pneumonia in October 2018.  Initially treated with doxycycline which he did not tolerate and completed a course of azithromycin and Levaquin with improvement in symptoms.   He was hospitalized again with recurrent pneumothorax on the right.  Seen by Dr. Dorris Fetch with a chest tube which failed and ultimately underwent VATS on 08/16/2020   Interim history: Doing well with his breathing.  Recently [place] a trip [place] with [place] and went hiking without any issue Continues on anoro inhaler  Outpatient Encounter Medications as of 04/29/2022  Medication Sig   acetaminophen (TYLENOL) 325 MG tablet Take 650 mg by mouth every 6 (six) hours as needed for headache (pain).   albuterol (VENTOLIN HFA) 108 (90 Base) MCG/ACT inhaler Inhale 2 puffs into the lungs every 6 (six) hours as needed for wheezing or shortness of breath.   Ascorbic Acid (VITAMIN C) 1000 MG tablet Take 1,000 mg by mouth daily.   cetirizine (ZYRTEC) 10 MG tablet Take 10 mg by mouth at bedtime.    Cholecalciferol (VITAMIN D3 PO) Take 1 tablet by mouth at bedtime.   diphenhydramine-acetaminophen (TYLENOL PM) 25-500 MG TABS tablet Take 1 tablet by mouth at bedtime.    esomeprazole (NEXIUM) 20 MG capsule Take 20 mg by mouth daily.    fluticasone (FLONASE) 50 MCG/ACT nasal spray SPRAY 1 OR 2 TIMES INTO EACH NOSTRIL TWICE A DAY (Patient taking differently: Place 1 spray into both nostrils daily as needed for allergies or rhinitis.)   LANSOPRAZOLE PO Take 1 tablet by mouth at bedtime.    melatonin 5 MG TABS Take 10 mg by mouth at bedtime.    Multiple Vitamin (MULTIVITAMIN WITH MINERALS) TABS tablet Take 1 tablet by mouth daily.   naproxen sodium (ALEVE) 220 MG tablet Take 220-440 mg by mouth 2 (two) times daily as needed (pain/headache).   OXYGEN Inhale 2 L into the lungs at bedtime. During sleep   phenylephrine (SUDAFED PE) 10 MG  TABS tablet Take 10 mg by mouth every 4 (four) hours as needed (congestion).    umeclidinium-vilanterol (ANORO ELLIPTA) 62.5-25 MCG/INH AEPB INHALE 1 PUFF BY MOUTH EVERY DAY   No facility-administered encounter medications on file as of 04/29/2022.    Physical Exam: Blood pressure 140/80, pulse 80, temperature 97.8 F (36.6 C), temperature source Oral, height 6\' 1"  (1.854 m), weight 208 lb 9.6 oz (94.6 kg), SpO2 97 %. Gen:      No acute distress HEENT:  EOMI, sclera anicteric Neck:     No masses; no  thyromegaly Lungs:    Clear to auscultation bilaterally; normal respiratory effort CV:         Regular rate and rhythm; no murmurs Abd:      + bowel sounds; soft, non-tender; no palpable masses, no distension Ext:    No edema; adequate peripheral perfusion Skin:      Warm and dry; no rash Neuro: alert and oriented x 3 Psych: normal mood and affect   Data Reviewed: Imaging: CT chest without contrast 09/16/16- Severe bullous emphysema. Pneumothrax, B/L LL consolidations R > L.  High-resolution CT of the chest 11/25/16-  no evidence of interstitial lung disease, postsurgical scarring. Several subcentimeter scattered pulmonary lymph nodes. Moderate-severe emphysema. Chest x-ray 08/03/17-left lung infiltrate with multilobar pneumonia, postsurgical changes in the left lung Chest x-ray 08/27/17-improving left lung infiltrate Chest x-ray 09/16/17- stable left lung apical opacity Chest x-ray 10/16/17-clearing of left upper lobe infiltrate.  Stable scarring, postoperative changes on the left. CT chest 04/16/2018- advanced emphysematous changes.  Stable lung nodules. CT chest 05/23/2019- stable findings of emphysema and lung nodules Chest x-ray 08/10/2020-moderate pneumothorax on the right CT chest 08/15/2020-large right pneumothorax with severe bullous emphysema Chest x-ray 05/08/2021-stable scarring, no acute disease I reviewed the images personally.  PFTs 12/10/16 FVC 2.90 (76%), FEV1 2.55 [66%], F/F 65, TLC 81%, DLCO 44% Moderate obstructive airway disease, severe diffusion defect.  Labs: Immune serologies 10/06/16 ANA positive, ENA RNP positive  A1 AT 12/12/16- 147, PIMM phenotype. CBC 09/16/17-absolute eosinophil count 88  Surgical pathology 09/17/16- lung parenchyma with bullae and lung fibrosis,  bronchiolitis obliterans organizing pneumonia.   Assessment:  Recurrent pneumothorax He has history of Spontaneous tension pneumothorax on the left s/p VATS, blebectomy, apical pleurectomy, mechanical  pleural abrasion- Nov 2017 Developed pneumothorax on right and underwent right VATS in October 2021  Severe bullous emphysema COPD Ex smoker Stable on anoro.  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 A follow-up CT scan has shown resolution of any interstitial opacities. An autoimmune workup shows elevation in ANA and ENA which is likely nonspecific.  Lung nodules Has remained stable since 2017.  No further follow-up required  Health maintenance Up-to-date with pneumococcal booster Up-to-date with flu and COVID-19 vaccination  Plan/Recommendations: - Continue anoro   2018 MD [facility] 04/29/2022, 2:28 PM  CC: 05/01/2022, MD

## 2022-06-28 ENCOUNTER — Other Ambulatory Visit: Payer: Self-pay | Admitting: Pulmonary Disease

## 2022-10-14 ENCOUNTER — Ambulatory Visit (INDEPENDENT_AMBULATORY_CARE_PROVIDER_SITE_OTHER): Payer: Medicare Other | Admitting: Pulmonary Disease

## 2022-10-14 ENCOUNTER — Ambulatory Visit (INDEPENDENT_AMBULATORY_CARE_PROVIDER_SITE_OTHER): Payer: Medicare Other

## 2022-10-14 ENCOUNTER — Encounter: Payer: Self-pay | Admitting: Pulmonary Disease

## 2022-10-14 VITALS — BP 142/80 | HR 72 | Temp 97.9°F | Ht 73.0 in | Wt 211.6 lb

## 2022-10-14 DIAGNOSIS — R0602 Shortness of breath: Secondary | ICD-10-CM | POA: Diagnosis not present

## 2022-10-14 DIAGNOSIS — Z23 Encounter for immunization: Secondary | ICD-10-CM

## 2022-10-14 MED ORDER — AREXVY 120 MCG/0.5ML IM SUSR: 0.5000 mL | Freq: Once | INTRAMUSCULAR | 0 refills | Status: AC

## 2022-10-14 NOTE — Addendum Note (Signed)
Addended by: Jacquiline Doe on: 10/14/2022 01:43 PM   Modules accepted: Orders

## 2022-10-14 NOTE — Patient Instructions (Addendum)
Will give you a flu vaccination today and send a prescription for RSV vaccination Chest x-ray today Continue Anoro Follow-up in 6 months.

## 2022-10-14 NOTE — Progress Notes (Signed)
Jay Mitchell    [id number]    08-10-1956  Primary Care Physician:Burns, Bobette Mo, MD  Referring Physician: Pincus Sanes, MD [address] [place],  [place] [address]  Chief complaint:  Follow-up for  Spontaneous pneumothorax COPD BOOP> resolved Reccurent pnuemonia  HPI: Patient is a 66 y.o. with reported history of COPD, remote history of tobacco use, and GE reflux disease who was hospitalized in early  nov 2017 when he developed sudden onset of left-sided chest pain and shortness of breath. At the time he was traveling in the [place] part of [place] for business where he was attending a conference. He was evaluated in the emergency department there and found to have a large left pneumothorax with complete collapse of the left lung, due to ruptured bleb. A small bore chest tube was placed but the lung only partially reexpanded. Subsequently the initial chest tube was removed and the larger chest tube placed by a surgeon. The patient was noted to have persistent air leak, and at the patient's request he was transferred to [facility] on 09/16/16  for further management. Cardiothoracic surgery was consulted for evaluation of left sided pneumothorax and subsequently underwent VATS with multiple blebectomy, apical pleurectomy, and mechanical pleural abrasion on 09/17/16. He is a former smoker who quit in 2004. 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  He has a COPD flare and left-sided pneumonia in October 2018.  Initially treated with doxycycline which he did not tolerate and completed a course of azithromycin and Levaquin with improvement in symptoms.   He was hospitalized again with recurrent pneumothorax on the right.  Seen by Dr. Dorris Fetch with a chest tube which failed and ultimately underwent VATS on 08/16/2020  Interim history: Continues on Anoro inhaler.  Breathing is stable without any  issue.  Outpatient Encounter Medications as of 10/14/2022  Medication Sig   acetaminophen (TYLENOL) 325 MG tablet Take 650 mg by mouth every 6 (six) hours as needed for headache (pain).   albuterol (VENTOLIN HFA) 108 (90 Base) MCG/ACT inhaler Inhale 2 puffs into the lungs every 6 (six) hours as needed for wheezing or shortness of breath.   ANORO ELLIPTA 62.5-25 MCG/ACT AEPB USE 1 INHALATION BY MOUTH  ONCE DAILY AT THE SAME TIME EACH DAY   Ascorbic Acid (VITAMIN C) 1000 MG tablet Take 1,000 mg by mouth daily.   cetirizine (ZYRTEC) 10 MG tablet Take 10 mg by mouth at bedtime.    Cholecalciferol (VITAMIN D3 PO) Take 1 tablet by mouth at bedtime.   diphenhydramine-acetaminophen (TYLENOL PM) 25-500 MG TABS tablet Take 1 tablet by mouth at bedtime.    esomeprazole (NEXIUM) 20 MG capsule Take 20 mg by mouth daily.    fluticasone (FLONASE) 50 MCG/ACT nasal spray SPRAY 1 OR 2 TIMES INTO EACH NOSTRIL TWICE A DAY (Patient taking differently: Place 1 spray into both nostrils daily as needed for allergies or rhinitis.)   LANSOPRAZOLE PO Take 1 tablet by mouth at bedtime.    melatonin 5 MG TABS Take 10 mg by mouth at bedtime.    Multiple Vitamin (MULTIVITAMIN WITH MINERALS) TABS tablet Take 1 tablet by mouth daily.   naproxen sodium (ALEVE) 220 MG tablet Take 220-440 mg by mouth 2 (two) times daily as needed (pain/headache).   OXYGEN Inhale 2 L into the lungs at bedtime. During sleep  phenylephrine (SUDAFED PE) 10 MG TABS tablet Take 10 mg by mouth every 4 (four) hours as needed (congestion).    No facility-administered encounter medications on file as of 10/14/2022.    Physical Exam: Blood pressure (!) 142/80, pulse 72, temperature 97.9 F (36.6 C), temperature source Oral, height 6\' 1"  (1.854 m), weight 211 lb 9.6 oz (96 kg), SpO2 98 %. Gen:      No acute distress HEENT:  EOMI, sclera anicteric Neck:     No masses; no thyromegaly Lungs:    Clear to auscultation bilaterally; normal respiratory  effort CV:         Regular rate and rhythm; no murmurs Abd:      + bowel sounds; soft, non-tender; no palpable masses, no distension Ext:    No edema; adequate peripheral perfusion Skin:      Warm and dry; no rash Neuro: alert and oriented x 3 Psych: normal mood and affect   Data Reviewed: Imaging: CT chest without contrast 09/16/16- Severe bullous emphysema. Pneumothrax, B/L LL consolidations R > L.  High-resolution CT of the chest 11/25/16-  no evidence of interstitial lung disease, postsurgical scarring. Several subcentimeter scattered pulmonary lymph nodes. Moderate-severe emphysema. Chest x-ray 08/03/17-left lung infiltrate with multilobar pneumonia, postsurgical changes in the left lung Chest x-ray 08/27/17-improving left lung infiltrate Chest x-ray 09/16/17- stable left lung apical opacity Chest x-ray 10/16/17-clearing of left upper lobe infiltrate.  Stable scarring, postoperative changes on the left. CT chest 04/16/2018- advanced emphysematous changes.  Stable lung nodules. CT chest 05/23/2019- stable findings of emphysema and lung nodules Chest x-ray 08/10/2020-moderate pneumothorax on the right CT chest 08/15/2020-large right pneumothorax with severe bullous emphysema Chest x-ray 05/08/2021-stable scarring, no acute disease I reviewed the images personally.  PFTs 12/10/16 FVC 2.90 (76%), FEV1 2.55 [66%], F/F 65, TLC 81%, DLCO 44% Moderate obstructive airway disease, severe diffusion defect.  Labs: Immune serologies 10/06/16 ANA positive, ENA RNP positive  A1 AT 12/12/16- 147, PIMM phenotype. CBC 09/16/17-absolute eosinophil count 88  Surgical pathology 09/17/16- lung parenchyma with bullae and lung fibrosis,  bronchiolitis obliterans organizing pneumonia.   Assessment:  Recurrent pneumothorax He has history of Spontaneous tension pneumothorax on the left s/p VATS, blebectomy, apical pleurectomy, mechanical pleural abrasion- Nov 2017 Developed pneumothorax on right and underwent right  VATS in October 2021  Severe bullous emphysema COPD Ex smoker Stable on anoro.  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 A follow-up CT scan has shown resolution of any interstitial opacities. An autoimmune workup shows elevation in ANA and ENA which is likely nonspecific.  Lung nodules Has remained stable since 2017.  No further follow-up required He is beyond the cutoff for screening CTs of the chest (quit smoking in 2003).  Will get a chest x-ray today  Health maintenance Up-to-date with pneumococcal booster Up-to-date with flu and COVID-19 vaccination  Plan/Recommendations: - Continue anoro - Chest x-ray - Flu, RSV vaccination  2004 MD [facility] 10/14/2022, 1:04 PM  CC: 14/03/2022, MD

## 2023-04-15 ENCOUNTER — Ambulatory Visit (INDEPENDENT_AMBULATORY_CARE_PROVIDER_SITE_OTHER): Payer: Medicare Other | Admitting: Pulmonary Disease

## 2023-04-15 ENCOUNTER — Encounter: Payer: Self-pay | Admitting: Pulmonary Disease

## 2023-04-15 VITALS — BP 130/68 | HR 61 | Temp 97.7°F | Ht 73.0 in | Wt 202.4 lb

## 2023-04-15 DIAGNOSIS — J4489 Other specified chronic obstructive pulmonary disease: Secondary | ICD-10-CM

## 2023-04-15 DIAGNOSIS — J439 Emphysema, unspecified: Secondary | ICD-10-CM

## 2023-04-15 DIAGNOSIS — Z23 Encounter for immunization: Secondary | ICD-10-CM | POA: Diagnosis not present

## 2023-04-15 NOTE — Patient Instructions (Signed)
Glad you are doing well with your breathing Continue the Anoro inhaler Follow-up in 1 year

## 2023-04-15 NOTE — Progress Notes (Signed)
Jay Mitchell    [id number]    [birth year]  Primary Care Physician:Burns, Bobette Mo, MD  Referring Physician: Pincus Sanes, MD [address] [place],  [place] [address]  Chief complaint:  Follow-up for  Spontaneous pneumothorax COPD BOOP> resolved Reccurent pnuemonia  HPI: Patient is a 67 y.o. with reported history of COPD, remote history of tobacco use, and GE reflux disease who was hospitalized in early  nov 2017 when he developed sudden onset of left-sided chest pain and shortness of breath. At the time he was traveling in the [place] part of [place] for business where he was attending a conference. He was evaluated in the emergency department there and found to have a large left pneumothorax with complete collapse of the left lung, due to ruptured bleb. A small bore chest tube was placed but the lung only partially reexpanded. Subsequently the initial chest tube was removed and the larger chest tube placed by a surgeon. The patient was noted to have persistent air leak, and at the patient's request he was transferred to [facility] on 09/16/16  for further management. Cardiothoracic surgery was consulted for evaluation of left sided pneumothorax and subsequently underwent VATS with multiple blebectomy, apical pleurectomy, and mechanical pleural abrasion on 09/17/16. He is a former smoker who quit in 2004. 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  He has a COPD flare and left-sided pneumonia in October 2018.  Initially treated with doxycycline which he did not tolerate and completed a course of azithromycin and Levaquin with improvement in symptoms.   He was hospitalized again with recurrent pneumothorax on the right.  Seen by Dr. Dorris Fetch with a chest tube which failed and ultimately underwent VATS on 08/16/2020  Interim history: Continues on Anoro inhaler.  Breathing is stable without any  issue. Chest x-ray done at last visit in December 2023 did not show any acute abnormality.  Outpatient Encounter Medications as of 04/15/2023  Medication Sig   acetaminophen (TYLENOL) 325 MG tablet Take 650 mg by mouth every 6 (six) hours as needed for headache (pain).   albuterol (VENTOLIN HFA) 108 (90 Base) MCG/ACT inhaler Inhale 2 puffs into the lungs every 6 (six) hours as needed for wheezing or shortness of breath.   ANORO ELLIPTA 62.5-25 MCG/ACT AEPB USE 1 INHALATION BY MOUTH  ONCE DAILY AT THE SAME TIME EACH DAY   Ascorbic Acid (VITAMIN C) 1000 MG tablet Take 1,000 mg by mouth daily.   cetirizine (ZYRTEC) 10 MG tablet Take 10 mg by mouth at bedtime.    Cholecalciferol (VITAMIN D3 PO) Take 1 tablet by mouth at bedtime.   diphenhydramine-acetaminophen (TYLENOL PM) 25-500 MG TABS tablet Take 1 tablet by mouth at bedtime.    esomeprazole (NEXIUM) 20 MG capsule Take 20 mg by mouth daily.    fluticasone (FLONASE) 50 MCG/ACT nasal spray SPRAY 1 OR 2 TIMES INTO EACH NOSTRIL TWICE A DAY (Patient taking differently: Place 1 spray into both nostrils daily as needed for allergies or rhinitis.)   LANSOPRAZOLE PO Take 1 tablet by mouth at bedtime.    melatonin 5 MG TABS Take 10 mg by mouth at bedtime.    Multiple Vitamin (MULTIVITAMIN WITH MINERALS) TABS tablet Take 1 tablet by mouth daily.   naproxen sodium (ALEVE) 220 MG tablet Take 220-440 mg by mouth 2 (two) times daily as needed (pain/headache).  OXYGEN Inhale 2 L into the lungs at bedtime. During sleep   phenylephrine (SUDAFED PE) 10 MG TABS tablet Take 10 mg by mouth every 4 (four) hours as needed (congestion).    No facility-administered encounter medications on file as of 04/15/2023.    Physical Exam: Blood pressure 130/68, pulse 61, temperature 97.7 F (36.5 C), temperature source Oral, height 6\' 1"  (1.854 m), weight 202 lb 6.4 oz (91.8 kg), SpO2 98 %. Gen:      No acute distress HEENT:  EOMI, sclera anicteric Neck:     No masses; no  thyromegaly Lungs:    Clear to auscultation bilaterally; normal respiratory effort CV:         Regular rate and rhythm; no murmurs Abd:      + bowel sounds; soft, non-tender; no palpable masses, no distension Ext:    No edema; adequate peripheral perfusion Skin:      Warm and dry; no rash Neuro: alert and oriented x 3 Psych: normal mood and affect   Data Reviewed: Imaging: CT chest without contrast 09/16/16- Severe bullous emphysema. Pneumothrax, B/L LL consolidations R > L.  High-resolution CT of the chest 11/25/16-  no evidence of interstitial lung disease, postsurgical scarring. Several subcentimeter scattered pulmonary lymph nodes. Moderate-severe emphysema. Chest x-ray 08/03/17-left lung infiltrate with multilobar pneumonia, postsurgical changes in the left lung Chest x-ray 08/27/17-improving left lung infiltrate Chest x-ray 09/16/17- stable left lung apical opacity Chest x-ray 10/16/17-clearing of left upper lobe infiltrate.  Stable scarring, postoperative changes on the left. CT chest 04/16/2018- advanced emphysematous changes.  Stable lung nodules. CT chest 05/23/2019- stable findings of emphysema and lung nodules Chest x-ray 08/10/2020-moderate pneumothorax on the right CT chest 08/15/2020-large right pneumothorax with severe bullous emphysema Chest x-ray 05/08/2021-stable scarring, no acute disease Chest x-ray 08/15/2020-stable scarring, no acute disease. I reviewed the images personally.  PFTs 12/10/16 FVC 2.90 (76%), FEV1 2.55 [66%], F/F 65, TLC 81%, DLCO 44% Moderate obstructive airway disease, severe diffusion defect.  Labs: Immune serologies 10/06/16 ANA positive, ENA RNP positive  A1 AT 12/12/16- 147, PIMM phenotype. CBC 09/16/17-absolute eosinophil count 88  Surgical pathology 09/17/16- lung parenchyma with bullae and lung fibrosis,  bronchiolitis obliterans organizing pneumonia.   Assessment:  Recurrent pneumothorax He has history of Spontaneous tension pneumothorax on the  left s/p VATS, blebectomy, apical pleurectomy, mechanical pleural abrasion- Nov 2017 Developed pneumothorax on right and underwent right VATS in October 2021  Severe bullous emphysema COPD Ex smoker Stable on anoro.  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 A follow-up CT scan has shown resolution of any interstitial opacities. An autoimmune workup shows elevation in ANA and ENA which is likely nonspecific.  Lung nodules Has remained stable since 2017.  No further follow-up required He is beyond the cutoff for screening CTs of the chest (quit smoking in 2003).  Recent chest x-ray did not show any acute abnormality.  Health maintenance Up-to-date with pneumococcal booster Up-to-date with flu and COVID-19 vaccination  Plan/Recommendations: - Continue anoro - Flu, RSV vaccination in fall  Chilton Greathouse MD [facility] 04/15/2023, 10:22 AM  CC: Pincus Sanes, MD

## 2023-05-03 ENCOUNTER — Other Ambulatory Visit: Payer: Self-pay | Admitting: Pulmonary Disease

## 2024-04-18 ENCOUNTER — Ambulatory Visit

## 2024-04-18 ENCOUNTER — Ambulatory Visit (INDEPENDENT_AMBULATORY_CARE_PROVIDER_SITE_OTHER): Admitting: Pulmonary Disease

## 2024-04-18 ENCOUNTER — Encounter: Payer: Self-pay | Admitting: Pulmonary Disease

## 2024-04-18 VITALS — BP 160/81 | HR 77 | Ht 73.0 in | Wt 212.4 lb

## 2024-04-18 DIAGNOSIS — Z87891 Personal history of nicotine dependence: Secondary | ICD-10-CM

## 2024-04-18 DIAGNOSIS — J439 Emphysema, unspecified: Secondary | ICD-10-CM | POA: Diagnosis not present

## 2024-04-18 DIAGNOSIS — J4489 Other specified chronic obstructive pulmonary disease: Secondary | ICD-10-CM | POA: Diagnosis not present

## 2024-04-18 NOTE — Patient Instructions (Signed)
 VISIT SUMMARY:  You came in today for a routine follow-up for your COPD. You mentioned experiencing shortness of breath when exerting yourself, such as walking up steps, and an increase in coughing spells over the past few weeks, which you believe are due to allergies and the drying effects of your oxygen  machine. No new issues or concerns were noted during your visit.  YOUR PLAN:  -CHRONIC OBSTRUCTIVE PULMONARY DISEASE (COPD): COPD is a chronic lung condition that makes it hard to breathe. Your COPD is well-managed, but you experience shortness of breath when climbing stairs and have had more frequent coughing spells recently. Continue using your anoro inhaler daily. We will also order a chest x-ray to check your current lung status and ensure your vaccinations are up to date.  -LUNG SCARRING: Lung scarring is a result of previous surgeries and collapsed lungs. It remains unchanged with no new symptoms. We will order a chest x-ray to monitor the lung scarring.  INSTRUCTIONS:  Please continue using your anoro inhaler daily. We will schedule a chest x-ray to evaluate your lung status and monitor lung scarring. Make sure your vaccinations are up to date.

## 2024-04-18 NOTE — Progress Notes (Signed)
 Jay Mitchell    [id number]    Nov 03, 1956  Primary Care Physician:No primary care provider on file.  Referring Physician: No referring provider defined for this encounter.  Chief complaint:  Follow-up for  Spontaneous pneumothorax COPD BOOP> resolved Reccurent pnuemonia  HPI: Patient is a 68 y.o. with reported history of COPD, remote history of tobacco use, and GE reflux disease who was hospitalized in early  nov 2017 when he developed sudden onset of left-sided chest pain and shortness of breath. At the time he was traveling in the [place] part of [place]  for business where he was attending a conference. He was evaluated in the emergency department there and found to have a large left pneumothorax with complete collapse of the left lung, due to ruptured bleb. A small bore chest tube was placed but the lung only partially reexpanded. Subsequently the initial chest tube was removed and the larger chest tube placed by a surgeon. The patient was noted to have persistent air leak, and at the patient's request he was transferred to [facility] on 09/16/16  for further management. Cardiothoracic surgery was consulted for evaluation of left sided pneumothorax and subsequently underwent VATS with multiple blebectomy, apical pleurectomy, and mechanical pleural abrasion on 09/17/16. He is a former smoker who quit in 2004. 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  He has a COPD flare and left-sided pneumonia in October 2018.  Initially treated with doxycycline  which he did not tolerate and completed a course of azithromycin  and Levaquin  with improvement in symptoms.   He was hospitalized again with recurrent pneumothorax on the right.  Seen by Dr. Luna Salinas with a chest tube which failed and ultimately underwent VATS on 08/16/2020  Interim history: Discussed the use of AI scribe software for clinical note  transcription with the patient, who gave verbal consent to proceed.  History of Present Illness Jay Ruddick Rikard "TIM" is a 68 year old male with COPD who presents for a routine follow-up.  He experiences shortness of breath with exertion, such as walking up steps, but this symptom has not worsened recently. He continues to use his Anoro inhaler every morning for COPD management.  Over the past two to three weeks, he has experienced more frequent coughing spells, which he attributes to allergies and the drying effects of his oxygen  machine. His nasal passages and throat become dry, and he sometimes wakes up at night with his oxygen  pulled out due to nasal congestion.  He recalls having surgeries in the past that resulted in scarring in his lungs, which was noted in previous imaging studies.  No new issues or concerns today.    Outpatient Encounter Medications as of 04/18/2024  Medication Sig   acetaminophen  (TYLENOL ) 325 MG tablet Take 650 mg by mouth every 6 (six) hours as needed for headache (pain).   albuterol  (VENTOLIN  HFA) 108 (90 Base) MCG/ACT inhaler Inhale 2 puffs into the lungs every 6 (six) hours as needed for wheezing or shortness of breath.   ANORO ELLIPTA  62.5-25 MCG/ACT AEPB USE 1 INHALATION BY MOUTH ONCE  DAILY AT THE SAME TIME EACH DAY   Ascorbic Acid (VITAMIN C) 1000 MG tablet Take 1,000 mg by mouth daily.   cetirizine (ZYRTEC) 10 MG tablet Take 10 mg by mouth at bedtime.    Cholecalciferol (VITAMIN D3 PO) Take 1 tablet by mouth at  bedtime.   diphenhydramine -acetaminophen  (TYLENOL  PM) 25-500 MG TABS tablet Take 1 tablet by mouth at bedtime.    esomeprazole (NEXIUM) 20 MG capsule Take 20 mg by mouth daily.    fluticasone  (FLONASE ) 50 MCG/ACT nasal spray SPRAY 1 OR 2 TIMES INTO EACH NOSTRIL TWICE A DAY (Patient taking differently: Place 1 spray into both nostrils daily as needed for allergies or rhinitis.)   LANSOPRAZOLE PO Take 1 tablet by mouth at bedtime.    melatonin 5 MG TABS  Take 10 mg by mouth at bedtime.    Multiple Vitamin (MULTIVITAMIN WITH MINERALS) TABS tablet Take 1 tablet by mouth daily.   naproxen sodium (ALEVE) 220 MG tablet Take 220-440 mg by mouth 2 (two) times daily as needed (pain/headache).   OXYGEN  Inhale 2 L into the lungs at bedtime. During sleep   phenylephrine  (SUDAFED PE) 10 MG TABS tablet Take 10 mg by mouth every 4 (four) hours as needed (congestion).    No facility-administered encounter medications on file as of 04/18/2024.    Physical Exam: Blood pressure (!) 160/81, pulse 77, height 6\' 1"  (1.854 m), weight 212 lb 6.4 oz (96.3 kg), SpO2 97%. Gen:      No acute distress HEENT:  EOMI, sclera anicteric Neck:     No masses; no thyromegaly Lungs:    Clear to auscultation bilaterally; normal respiratory effort CV:         Regular rate and rhythm; no murmurs Abd:      + bowel sounds; soft, non-tender; no palpable masses, no distension Ext:    No edema; adequate peripheral perfusion Neuro: alert and oriented x 3 Psych: normal mood and affect   Data Reviewed: Imaging: CT chest without contrast 09/16/16- Severe bullous emphysema. Pneumothrax, B/L LL consolidations R > L.  High-resolution CT of the chest 11/25/16-  no evidence of interstitial lung disease, postsurgical scarring. Several subcentimeter scattered pulmonary lymph nodes. Moderate-severe emphysema. Chest x-ray 08/03/17-left lung infiltrate with multilobar pneumonia, postsurgical changes in the left lung Chest x-ray 08/27/17-improving left lung infiltrate Chest x-ray 09/16/17- stable left lung apical opacity Chest x-ray 10/16/17-clearing of left upper lobe infiltrate.  Stable scarring, postoperative changes on the left. CT chest 04/16/2018- advanced emphysematous changes.  Stable lung nodules. CT chest 05/23/2019- stable findings of emphysema and lung nodules Chest x-ray 08/10/2020-moderate pneumothorax on the right CT chest 08/15/2020-large right pneumothorax with severe bullous  emphysema Chest x-ray 10/14/2022-stable chronic scarring and postsurgical changes I reviewed the images personally.  PFTs 12/10/16 FVC 2.90 (76%), FEV1 2.55 [66%], F/F 65, TLC 81%, DLCO 44% Moderate obstructive airway disease, severe diffusion defect.  Labs: Immune serologies 10/06/16 ANA positive, ENA RNP positive  A1 AT 12/12/16- 147, PIMM phenotype. CBC 09/16/17-absolute eosinophil count 88  Surgical pathology 09/17/16- lung parenchyma with bullae and lung fibrosis,  bronchiolitis obliterans organizing pneumonia.   Assessment:  Recurrent pneumothorax He has history of Spontaneous tension pneumothorax on the left s/p VATS, blebectomy, apical pleurectomy, mechanical pleural abrasion- Nov 2017 Developed pneumothorax on right and underwent right VATS in October 2021  Severe bullous emphysema COPD Ex smoker Stable on anoro.  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 A follow-up CT scan has shown resolution of any interstitial opacities. An autoimmune workup shows elevation in ANA and ENA which is likely nonspecific.  Lung nodules Has remained stable since 2017.  No further follow-up required He is beyond the cutoff for screening CTs of the chest (quit smoking in 2003).  Chest x-ray today  Health maintenance Up-to-date with pneumococcal booster Up-to-date with flu and COVID-19 vaccination  Plan/Recommendations: - Continue anoro - Chest x-ray  Phyllis Breeze MD [facility] 04/18/2024, 11:58 AM  CC: No ref. provider found

## 2024-04-22 ENCOUNTER — Ambulatory Visit: Payer: Self-pay | Admitting: Pulmonary Disease

## 2024-06-24 ENCOUNTER — Other Ambulatory Visit: Payer: Self-pay | Admitting: Pulmonary Disease

## 2024-11-12 ENCOUNTER — Other Ambulatory Visit: Payer: Self-pay | Admitting: Pulmonary Disease
